# Patient Record
Sex: Male | Born: 1945 | Race: White | Hispanic: No | Marital: Married | Smoking: Former smoker
Health system: Southern US, Community
[De-identification: ages and names within clinical notes are randomized; demographics above are authoritative.]

## PROBLEM LIST (undated history)

## (undated) DIAGNOSIS — R011 Cardiac murmur, unspecified: Secondary | ICD-10-CM

## (undated) DIAGNOSIS — I700 Atherosclerosis of aorta: Secondary | ICD-10-CM

## (undated) DIAGNOSIS — E119 Type 2 diabetes mellitus without complications: Secondary | ICD-10-CM

## (undated) DIAGNOSIS — G4700 Insomnia, unspecified: Secondary | ICD-10-CM

## (undated) DIAGNOSIS — L570 Actinic keratosis: Secondary | ICD-10-CM

## (undated) DIAGNOSIS — E781 Pure hyperglyceridemia: Secondary | ICD-10-CM

## (undated) DIAGNOSIS — N189 Chronic kidney disease, unspecified: Secondary | ICD-10-CM

## (undated) DIAGNOSIS — K5790 Diverticulosis of intestine, part unspecified, without perforation or abscess without bleeding: Secondary | ICD-10-CM

## (undated) DIAGNOSIS — B964 Proteus (mirabilis) (morganii) as the cause of diseases classified elsewhere: Secondary | ICD-10-CM

## (undated) DIAGNOSIS — E786 Lipoprotein deficiency: Secondary | ICD-10-CM

## (undated) DIAGNOSIS — E669 Obesity, unspecified: Secondary | ICD-10-CM

## (undated) DIAGNOSIS — Z87891 Personal history of nicotine dependence: Secondary | ICD-10-CM

## (undated) DIAGNOSIS — N1832 Chronic kidney disease, stage 3b: Secondary | ICD-10-CM

## (undated) DIAGNOSIS — G4730 Sleep apnea, unspecified: Secondary | ICD-10-CM

## (undated) DIAGNOSIS — C4491 Basal cell carcinoma of skin, unspecified: Secondary | ICD-10-CM

## (undated) DIAGNOSIS — I10 Essential (primary) hypertension: Secondary | ICD-10-CM

## (undated) DIAGNOSIS — E785 Hyperlipidemia, unspecified: Secondary | ICD-10-CM

## (undated) DIAGNOSIS — Z87442 Personal history of urinary calculi: Secondary | ICD-10-CM

## (undated) DIAGNOSIS — N529 Male erectile dysfunction, unspecified: Secondary | ICD-10-CM

## (undated) DIAGNOSIS — N400 Enlarged prostate without lower urinary tract symptoms: Secondary | ICD-10-CM

## (undated) HISTORY — PX: MM BCCCP - DIAG MAMMO W/CAD (ARMC HX): HXRAD1021

## (undated) HISTORY — PX: COLONOSCOPY W/ POLYPECTOMY: SHX1380

## (undated) HISTORY — PX: COLON SURGERY: SHX602

## (undated) HISTORY — DX: Actinic keratosis: L57.0

## (undated) HISTORY — PX: TONSILLECTOMY: SUR1361

---
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
## 1999-04-13 ENCOUNTER — Other Ambulatory Visit: Payer: Self-pay | Admitting: Dermatology

## 2006-02-21 ENCOUNTER — Ambulatory Visit: Payer: Self-pay | Admitting: Urology

## 2006-03-30 ENCOUNTER — Observation Stay: Payer: Self-pay | Admitting: Otolaryngology

## 2006-12-03 ENCOUNTER — Ambulatory Visit: Payer: Self-pay | Admitting: Internal Medicine

## 2006-12-05 ENCOUNTER — Ambulatory Visit: Payer: Self-pay | Admitting: Urology

## 2007-05-22 ENCOUNTER — Ambulatory Visit: Payer: Self-pay | Admitting: Gastroenterology

## 2008-08-13 IMAGING — CT CT ABD-PELV W/ CM
1 of 3 series · 12 of 32 positions shown, 18 images · non-contrast
Comparison: none

REASON FOR EXAM: LLQ abd pain
COMMENTS:

[Series 2: abdomen · axial · 0.86mm/px · z∈[-650,-210]mm · 12 of 65 slices shown, 18 images]
[im 5/65  soft-tissue]
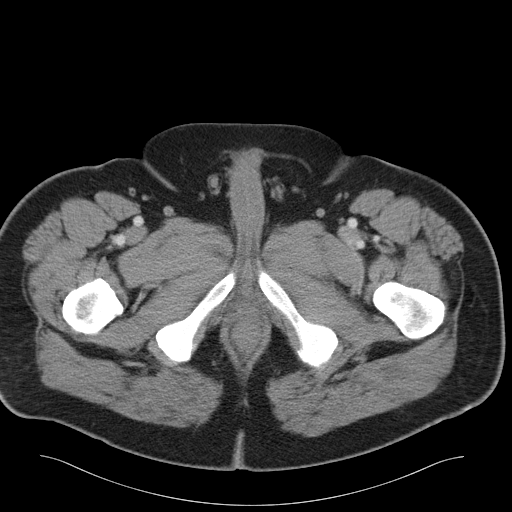
[im 5/65  bone]
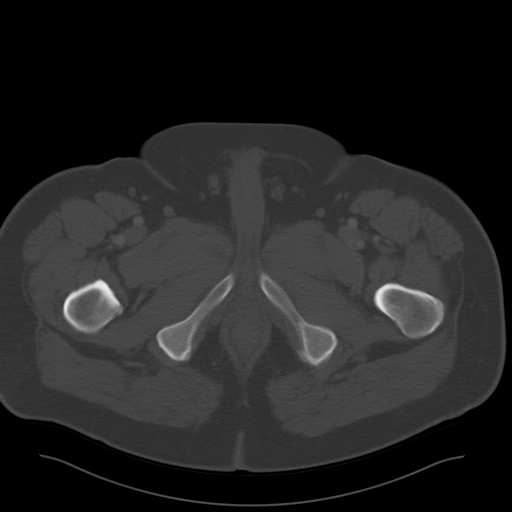
[im 10/65  soft-tissue]
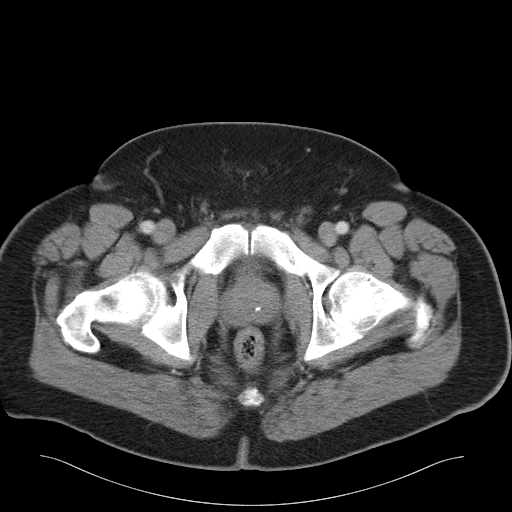
[im 15/65  soft-tissue]
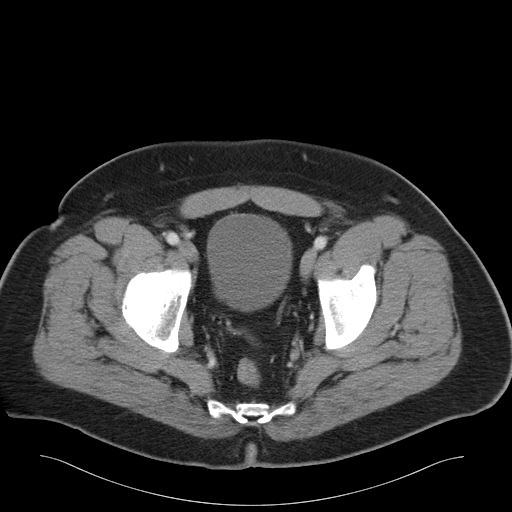
[im 20/65  soft-tissue]
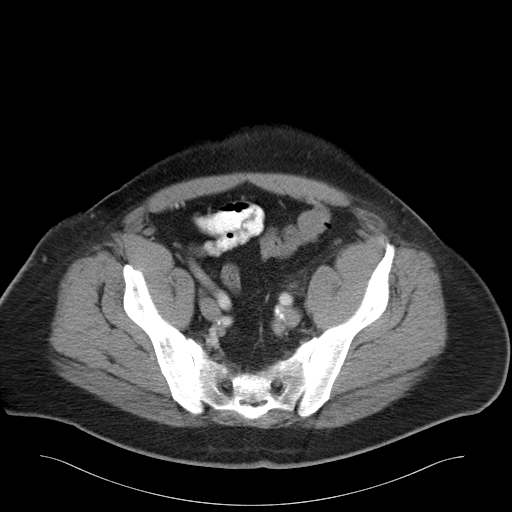
[im 25/65  soft-tissue]
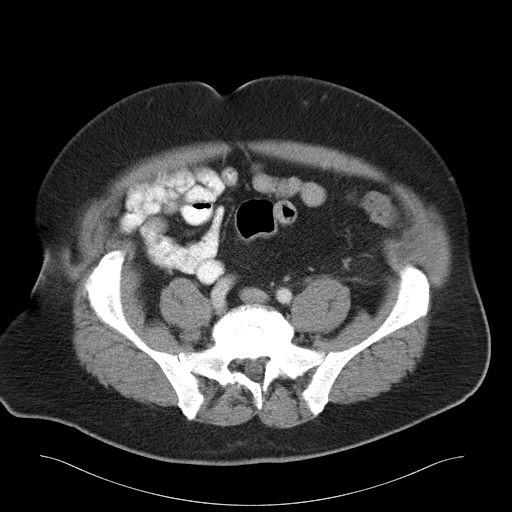
[im 30/65  soft-tissue]
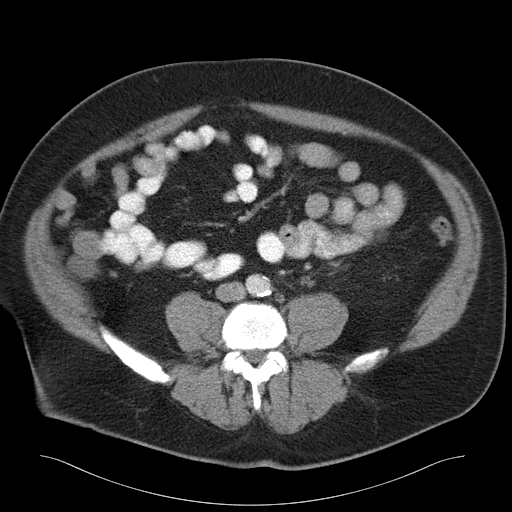
[im 35/65  soft-tissue]
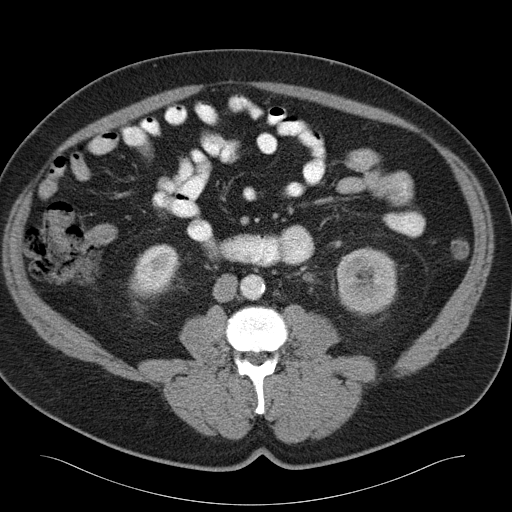
[im 40/65  soft-tissue]
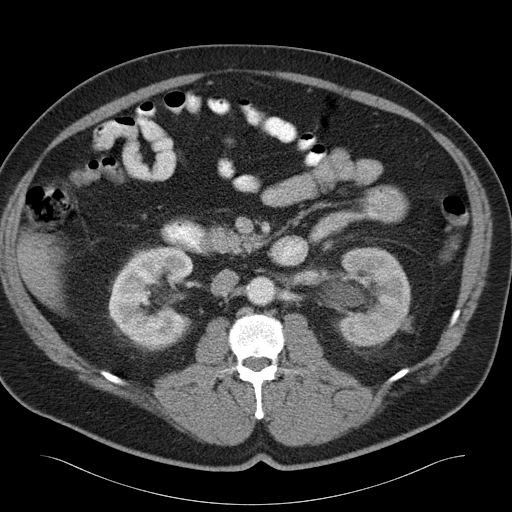
[im 45/65  soft-tissue]
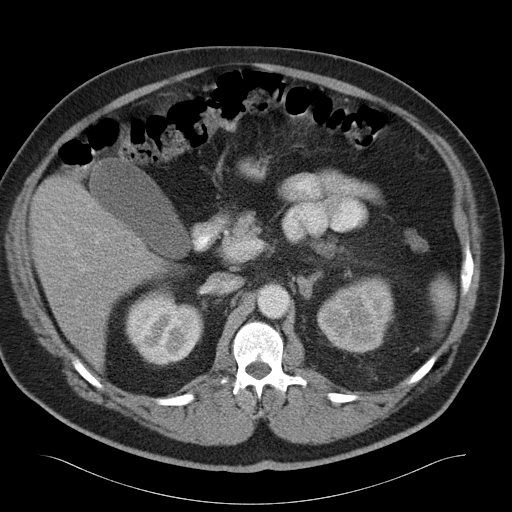
[im 45/65  lung]
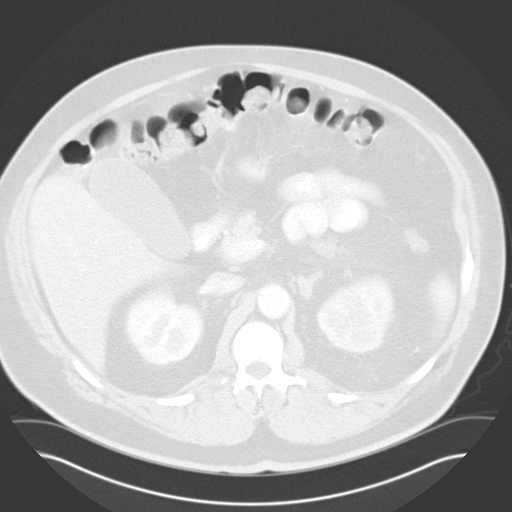
[im 45/65  bone]
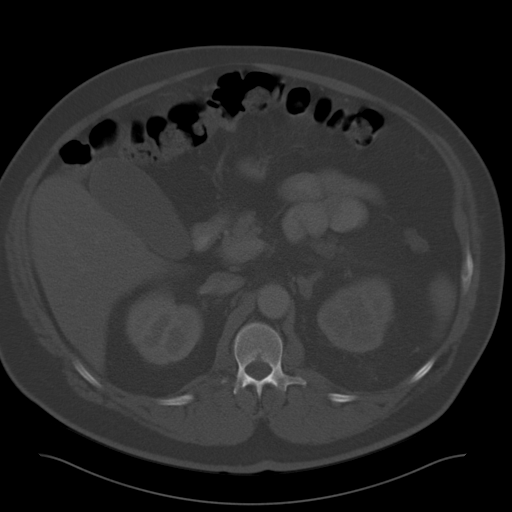
[im 50/65  soft-tissue]
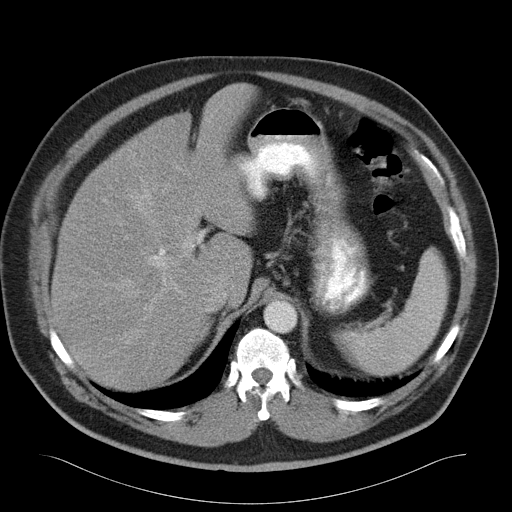
[im 50/65  lung]
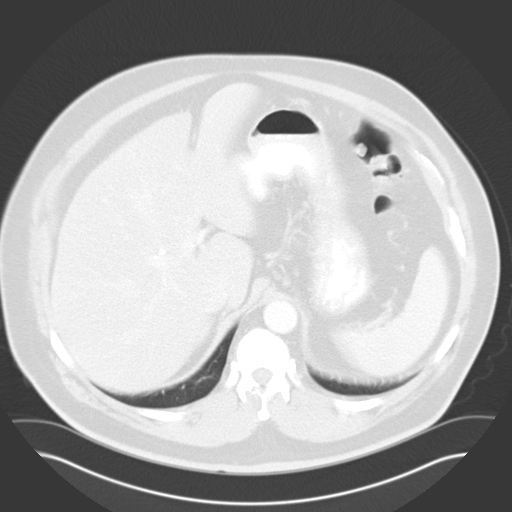
[im 55/65  soft-tissue]
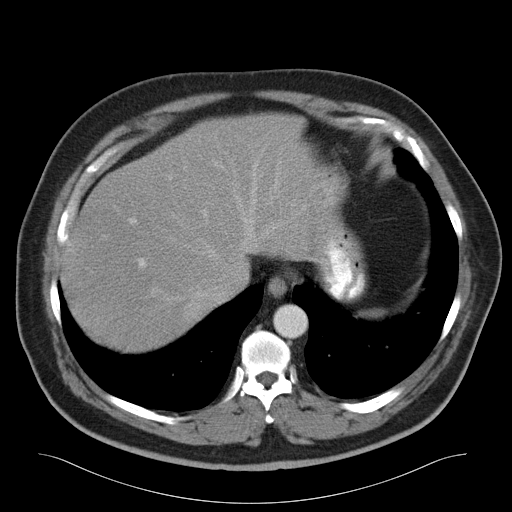
[im 55/65  lung]
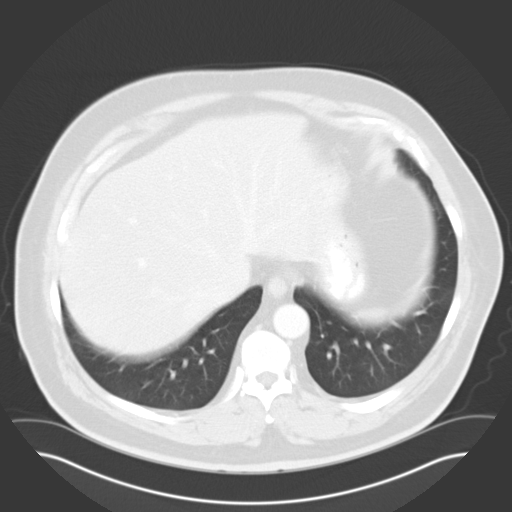
[im 60/65  soft-tissue]
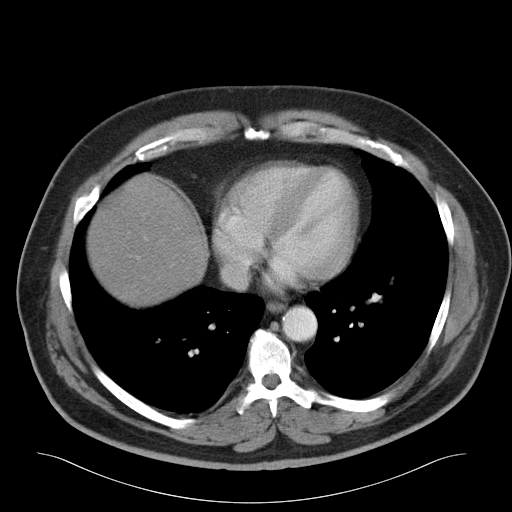
[im 60/65  lung]
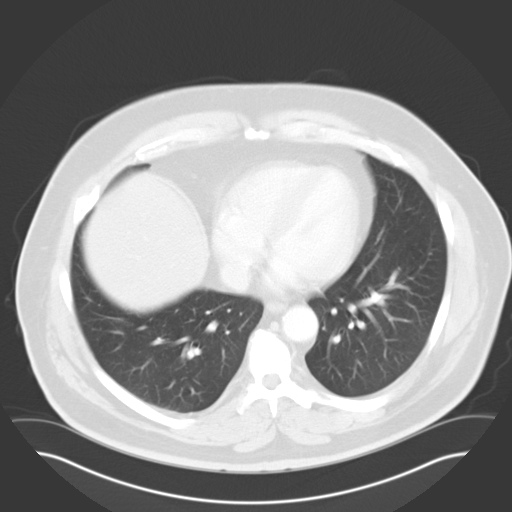

[12 of 32 positions shown; findings below may reference images not displayed]

PROCEDURE:     CT  - CT ABDOMEN / PELVIS  W  - December 03, 2006  [DATE]

RESULT:     IV contrast and oral contrast enhanced CT scan of the abdomen
and pelvis is performed. There is mild to moderate LEFT sided hydronephrosis
and proximal hydroureter. There is a stone demonstrated in the mid LEFT
ureter best seen on image #37 and measuring up to 6 mm in diameter. No
additional LEFT ureteral or LEFT renal calculi are seen. No radiopaque
gallstones are evident. There is prostatic calcification present. The liver,
spleen, pancreas, aorta and kidneys otherwise appear to be normal. The
adrenal glands are unremarkable.  No acute inflammatory changes are evident
elsewhere. The appendix appears to be normal.
IMPRESSION: 1)Obstructing mid LEFT ureteral stone with LEFT sided hydronephrosis.
Urologic follow-up is recommended. Stone diameter is approximately 6 mm.

## 2011-01-03 ENCOUNTER — Ambulatory Visit: Payer: Self-pay | Admitting: Gastroenterology

## 2013-05-07 DIAGNOSIS — C4491 Basal cell carcinoma of skin, unspecified: Secondary | ICD-10-CM

## 2013-05-07 HISTORY — DX: Basal cell carcinoma of skin, unspecified: C44.91

## 2016-04-11 ENCOUNTER — Encounter: Payer: Self-pay | Admitting: *Deleted

## 2016-04-12 ENCOUNTER — Encounter: Payer: Self-pay | Admitting: *Deleted

## 2016-04-12 ENCOUNTER — Encounter
Admission: RE | Disposition: A | Discharge status: Home / Self Care | Payer: Self-pay | Source: Ambulatory Visit | Attending: Unknown Physician Specialty

## 2016-04-12 ENCOUNTER — Ambulatory Visit: Payer: Commercial Managed Care - HMO | Admitting: Anesthesiology

## 2016-04-12 ENCOUNTER — Ambulatory Visit
Admission: RE | Admit: 2016-04-12 | Discharge: 2016-04-12 | Disposition: A | Discharge status: Home / Self Care | Payer: Commercial Managed Care - HMO | Source: Ambulatory Visit | Attending: Unknown Physician Specialty | Admitting: Unknown Physician Specialty

## 2016-04-12 DIAGNOSIS — K640 First degree hemorrhoids: Secondary | ICD-10-CM | POA: Diagnosis not present

## 2016-04-12 DIAGNOSIS — I10 Essential (primary) hypertension: Secondary | ICD-10-CM | POA: Insufficient documentation

## 2016-04-12 DIAGNOSIS — Z6841 Body Mass Index (BMI) 40.0 and over, adult: Secondary | ICD-10-CM | POA: Diagnosis not present

## 2016-04-12 DIAGNOSIS — Z87891 Personal history of nicotine dependence: Secondary | ICD-10-CM | POA: Insufficient documentation

## 2016-04-12 DIAGNOSIS — Z1211 Encounter for screening for malignant neoplasm of colon: Secondary | ICD-10-CM | POA: Diagnosis present

## 2016-04-12 DIAGNOSIS — D125 Benign neoplasm of sigmoid colon: Secondary | ICD-10-CM | POA: Insufficient documentation

## 2016-04-12 DIAGNOSIS — E785 Hyperlipidemia, unspecified: Secondary | ICD-10-CM | POA: Diagnosis not present

## 2016-04-12 DIAGNOSIS — K5730 Diverticulosis of large intestine without perforation or abscess without bleeding: Secondary | ICD-10-CM | POA: Insufficient documentation

## 2016-04-12 DIAGNOSIS — Z79899 Other long term (current) drug therapy: Secondary | ICD-10-CM | POA: Insufficient documentation

## 2016-04-12 DIAGNOSIS — N400 Enlarged prostate without lower urinary tract symptoms: Secondary | ICD-10-CM | POA: Diagnosis not present

## 2016-04-12 DIAGNOSIS — E119 Type 2 diabetes mellitus without complications: Secondary | ICD-10-CM | POA: Diagnosis not present

## 2016-04-12 DIAGNOSIS — Z7982 Long term (current) use of aspirin: Secondary | ICD-10-CM | POA: Insufficient documentation

## 2016-04-12 DIAGNOSIS — E669 Obesity, unspecified: Secondary | ICD-10-CM | POA: Diagnosis not present

## 2016-04-12 DIAGNOSIS — K621 Rectal polyp: Secondary | ICD-10-CM | POA: Diagnosis not present

## 2016-04-12 HISTORY — DX: Hyperlipidemia, unspecified: E78.5

## 2016-04-12 HISTORY — DX: Pure hyperglyceridemia: E78.1

## 2016-04-12 HISTORY — PX: COLONOSCOPY WITH PROPOFOL: SHX5780

## 2016-04-12 HISTORY — DX: Benign prostatic hyperplasia without lower urinary tract symptoms: N40.0

## 2016-04-12 HISTORY — DX: Type 2 diabetes mellitus without complications: E11.9

## 2016-04-12 HISTORY — DX: Essential (primary) hypertension: I10

## 2016-04-12 HISTORY — DX: Obesity, unspecified: E66.9

## 2016-04-12 HISTORY — DX: Lipoprotein deficiency: E78.6

## 2016-04-12 LAB — GLUCOSE, CAPILLARY: GLUCOSE-CAPILLARY: 191 mg/dL — AB (ref 65–99)

## 2016-04-12 SURGERY — COLONOSCOPY WITH PROPOFOL
Anesthesia: General

## 2016-04-12 MED ORDER — SODIUM CHLORIDE 0.9 % IV SOLN
INTRAVENOUS | Status: DC
  Administered 2016-04-12: 07:00:00 via INTRAVENOUS

## 2016-04-12 MED ORDER — LIDOCAINE HCL (PF) 2 % IJ SOLN
INTRAMUSCULAR | Status: DC | PRN
  Administered 2016-04-12: 60 mg

## 2016-04-12 MED ORDER — PROPOFOL 500 MG/50ML IV EMUL
INTRAVENOUS | Status: DC | PRN
  Administered 2016-04-12: 75 ug/kg/min via INTRAVENOUS

## 2016-04-12 MED ORDER — FENTANYL CITRATE (PF) 100 MCG/2ML IJ SOLN
INTRAMUSCULAR | Status: DC | PRN
  Administered 2016-04-12: 50 ug via INTRAVENOUS

## 2016-04-12 MED ORDER — MIDAZOLAM HCL 5 MG/5ML IJ SOLN
INTRAMUSCULAR | Status: DC | PRN
  Administered 2016-04-12: 1 mg via INTRAVENOUS

## 2016-04-12 MED ORDER — SODIUM CHLORIDE 0.9 % IV SOLN: INTRAVENOUS | Status: DC

## 2016-04-12 MED ORDER — PROPOFOL 10 MG/ML IV BOLUS
INTRAVENOUS | Status: DC | PRN
  Administered 2016-04-12: 20 mg via INTRAVENOUS
  Administered 2016-04-12: 30 mg via INTRAVENOUS

## 2016-04-12 NOTE — Op Note (Signed)
[facility] Gastroenterology Patient Name: Stephen Mahoney Procedure Date: 04/12/2016 7:29 AM MRN: [id number] Account #: [id number] Date of Birth: 1946/01/08 Admit Type: Outpatient Age: 70 Room: [facility] ENDO ROOM 1 Gender: Male Note Status: Finalized Procedure:            Colonoscopy Indications:          High risk colon cancer surveillance: Personal history                        of colonic polyps Providers:            Manya Silvas, MD Referring MD:         Dion Body (Referring MD) Medicines:            Propofol per Anesthesia Complications:        No immediate complications. Procedure:            Pre-Anesthesia Assessment:                       - After reviewing the risks and benefits, the patient                        was deemed in satisfactory condition to undergo the                        procedure.                       After obtaining informed consent, the colonoscope was                        passed under direct vision. Throughout the procedure,                        the patient's blood pressure, pulse, and oxygen                        saturations were monitored continuously. The                        Colonoscope was introduced through the anus and                        advanced to the the cecum, identified by appendiceal                        orifice and ileocecal valve. The colonoscopy was                        performed without difficulty. The patient tolerated the                        procedure well. The quality of the bowel preparation                        was excellent. Findings:      Rectal exam of prostate negative.      Two sessile polyps were found in the rectum. The polyps were diminutive       in size. These polyps were removed with a jumbo cold forceps. Resection       and retrieval were complete.  A diminutive polyp was found in the sigmoid colon. The polyp was       sessile. The polyp was removed with a jumbo cold  forceps. Resection and       retrieval were complete.      A small polyp was found in the proximal sigmoid colon. The polyp was       sessile. The polyp was removed with a hot snare. Resection and retrieval       were complete.      Multiple small-mouthed diverticula were found in the sigmoid colon,       descending colon and transverse colon.      Internal hemorrhoids were found during endoscopy. The hemorrhoids were       small, medium-sized and Grade I (internal hemorrhoids that do not       prolapse). Impression:           - Two diminutive polyps in the rectum, removed with a                        jumbo cold forceps. Resected and retrieved.                       - One diminutive polyp in the sigmoid colon, removed                        with a jumbo cold forceps. Resected and retrieved.                       - One small polyp in the proximal sigmoid colon,                        removed with a hot snare. Resected and retrieved.                       - Diverticulosis in the sigmoid colon, in the                        descending colon and in the transverse colon.                       - Internal hemorrhoids. Recommendation:       - Await pathology results. Manya Silvas, MD 04/12/2016 7:57:02 AM This report has been signed electronically. Number of Addenda: 0 Note Initiated On: 04/12/2016 7:29 AM Scope Withdrawal Time: 0 hours 13 minutes 40 seconds  Total Procedure Duration: 0 hours 19 minutes 8 seconds       [facility]

## 2016-04-12 NOTE — Anesthesia Preprocedure Evaluation (Signed)
Anesthesia Evaluation  Patient identified by MRN, date of birth, ID band Patient awake    Reviewed: Allergy & Precautions, H&P , NPO status , Patient's Chart, lab work & pertinent test results  History of Anesthesia Complications Negative for: history of anesthetic complications  Airway Mallampati: III  TM Distance: >3 FB Neck ROM: limited    Dental  (+) Poor Dentition   Pulmonary neg shortness of breath, former smoker,    Pulmonary exam normal breath sounds clear to auscultation       Cardiovascular Exercise Tolerance: Good hypertension, (-) angina(-) Past MI and (-) DOE Normal cardiovascular exam Rhythm:regular Rate:Normal     Neuro/Psych negative neurological ROS  negative psych ROS   GI/Hepatic negative GI ROS, Neg liver ROS, neg GERD  ,  Endo/Other  diabetes, Type 2  Renal/GU negative Renal ROS  negative genitourinary   Musculoskeletal   Abdominal   Peds  Hematology negative hematology ROS (+)   Anesthesia Other Findings Past Medical History:   Hypertension                                                 Obesity                                                      BPH (benign prostatic hyperplasia)                           Diabetes mellitus without complication (HCC)                 High blood triglycerides                                     Hyperlipidemia with low HDL                                 Past Surgical History:   COLON SURGERY                                                 COLONOSCOPY W/ POLYPECTOMY                                    MM BCCCP - DIAG MAMMO W/CAD ([facility] HX)           N/A                Comment:left lip nose   TONSILLECTOMY                                                BMI    Body Mass Index   40.42 kg/m 2    Past Medical History:  Hypertension                                                 Obesity                                                      BPH (benign  prostatic hyperplasia)                           Diabetes mellitus without complication (HCC)                 High blood triglycerides                                     Hyperlipidemia with low HDL                                   Reproductive/Obstetrics negative OB ROS                             Anesthesia Physical Anesthesia Plan  ASA: III  Anesthesia Plan: General   Post-op Pain Management:    Induction:   Airway Management Planned:   Additional Equipment:   Intra-op Plan:   Post-operative Plan:   Informed Consent: I have reviewed the patients History and Physical, chart, labs and discussed the procedure including the risks, benefits and alternatives for the proposed anesthesia with the patient or authorized representative who has indicated his/her understanding and acceptance.   Dental Advisory Given  Plan Discussed with: Anesthesiologist, CRNA and Surgeon  Anesthesia Plan Comments:         Anesthesia Quick Evaluation

## 2016-04-12 NOTE — H&P (Signed)
   Primary Care Physician:  No primary care provider on file. Primary Gastroenterologist:  Dr. Vira Agar  Pre-Procedure History & Physical: HPI:  Stephen Mahoney is a 70 y.o. male is here for an colonoscopy.   Past Medical History  Diagnosis Date  . Hypertension   . Obesity   . BPH (benign prostatic hyperplasia)   . Diabetes mellitus without complication ([place])   . High blood triglycerides   . Hyperlipidemia with low HDL     Past Surgical History  Procedure Laterality Date  . Colon surgery    . Colonoscopy w/ polypectomy    . Mm bcccp - diag mammo w/cad ([facility] hx) N/A     left lip nose  . Tonsillectomy      Prior to Admission medications   Medication Sig Start Date End Date Taking? Authorizing Provider  amLODipine (NORVASC) 5 MG tablet Take 5 mg by mouth daily.   Yes Historical Provider, MD  aspirin 81 MG tablet Take 81 mg by mouth daily.   Yes Historical Provider, MD  Dutasteride-Tamsulosin HCl (JALYN) 0.5-0.4 MG CAPS Take by mouth.   Yes Historical Provider, MD  glucose blood test strip 1 each by Other route as needed for other. Use as instructed   Yes Historical Provider, MD  Liraglutide (VICTOZA [place]) Inject into the skin.   Yes Historical Provider, MD  metFORMIN (GLUCOPHAGE) 500 MG tablet Take by mouth 2 (two) times daily with a meal.   Yes Historical Provider, MD  valsartan-hydrochlorothiazide (DIOVAN-HCT) 320-25 MG tablet Take 1 tablet by mouth daily.   Yes Historical Provider, MD    Allergies as of 04/05/2016  . (Not on File)    History reviewed. No pertinent family history.  Social History   Social History  . Marital Status: Married    Spouse Name: N/A  . Number of Children: N/A  . Years of Education: N/A   Occupational History  . Not on file.   Social History Main Topics  . Smoking status: Former Smoker    Quit date: 04/12/2014  . Smokeless tobacco: Not on file  . Alcohol Use: No  . Drug Use: No  . Sexual Activity: Not on file   Other Topics Concern  .  Not on file   Social History Narrative    Review of Systems: See HPI, otherwise negative ROS  Physical Exam: BP 154/87 mmHg  Pulse 77  Temp(Src) 98.5 F (36.9 C) (Tympanic)  Resp 18  Ht 6\' 2"  (1.88 m)  Wt 142.883 kg (315 lb)  BMI 40.43 kg/m2  SpO2 95% General:   Alert,  pleasant and cooperative in NAD Head:  Normocephalic and atraumatic. Neck:  Supple; no masses or thyromegaly. Lungs:  Clear throughout to auscultation.    Heart:  Regular rate and rhythm. Abdomen:  Soft, nontender and nondistended. Normal bowel sounds, without guarding, and without rebound.   Neurologic:  Alert and  oriented x4;  grossly normal neurologically.  Impression/Plan: Stephen Mahoney is here for an colonoscopy to be performed for [facility] colon polyps  Risks, benefits, limitations, and alternatives regarding  colonoscopy have been reviewed with the patient.  Questions have been answered.  All parties agreeable.   Gaylyn Cheers, MD  04/12/2016, 7:22 AM

## 2016-04-12 NOTE — Anesthesia Postprocedure Evaluation (Signed)
Anesthesia Post Note  Patient: Stephen Mahoney  Procedure(s) Performed: Procedure(s) (LRB): COLONOSCOPY WITH PROPOFOL (N/A)  Patient location during evaluation: Endoscopy Anesthesia Type: General Level of consciousness: awake and alert Pain management: pain level controlled Vital Signs Assessment: post-procedure vital signs reviewed and stable Respiratory status: spontaneous breathing, nonlabored ventilation, respiratory function stable and patient connected to nasal cannula oxygen Cardiovascular status: blood pressure returned to baseline and stable Postop Assessment: no signs of nausea or vomiting Anesthetic complications: no    Last Vitals:  Filed Vitals:   04/12/16 0810 04/12/16 0820  BP: 120/67 108/79  Pulse: 80 79  Temp:    Resp: 15 15    Last Pain: There were no vitals filed for this visit.               Precious Haws Piscitello

## 2016-04-12 NOTE — Transfer of Care (Signed)
Immediate Anesthesia Transfer of Care Note  Patient: Stephen Mahoney  Procedure(s) Performed: Procedure(s): COLONOSCOPY WITH PROPOFOL (N/A)  Patient Location: PACU  Anesthesia Type:General  Level of Consciousness: sedated  Airway & Oxygen Therapy: Patient Spontanous Breathing and Patient connected to nasal cannula oxygen  Post-op Assessment: Report given to RN and Post -op Vital signs reviewed and stable  Post vital signs: Reviewed and stable  Last Vitals:  Filed Vitals:   04/12/16 0658  BP: 154/87  Pulse: 77  Temp: 36.9 C  Resp: 18    Last Pain: There were no vitals filed for this visit.       Complications: No apparent anesthesia complications

## 2016-04-13 LAB — SURGICAL PATHOLOGY

## 2016-04-17 ENCOUNTER — Encounter: Payer: Self-pay | Admitting: Unknown Physician Specialty

## 2018-03-12 ENCOUNTER — Other Ambulatory Visit: Payer: Self-pay | Admitting: Family Medicine

## 2018-03-12 ENCOUNTER — Ambulatory Visit
Admission: RE | Admit: 2018-03-12 | Discharge: 2018-03-12 | Disposition: A | Discharge status: Home / Self Care | Payer: Commercial Managed Care - HMO | Source: Ambulatory Visit | Attending: Family Medicine | Admitting: Family Medicine

## 2018-03-12 DIAGNOSIS — R220 Localized swelling, mass and lump, head: Secondary | ICD-10-CM

## 2019-11-21 IMAGING — CT CT NECK W/O CM
4 of 5 series · 15 of 33 positions shown, 17 images · non-contrast
Comparison: None.

CLINICAL DATA: Swelling on left side of neck beginning last
evening.

EXAM:
CT NECK WITHOUT CONTRAST
TECHNIQUE: Multidetector CT imaging of the neck was performed following the
standard protocol without intravenous contrast.

[Series 2: axial neck · axial · 0.65mm/px · z∈[-238,-110]mm · 3 of 129 slices shown]
[im 33/129  bone]
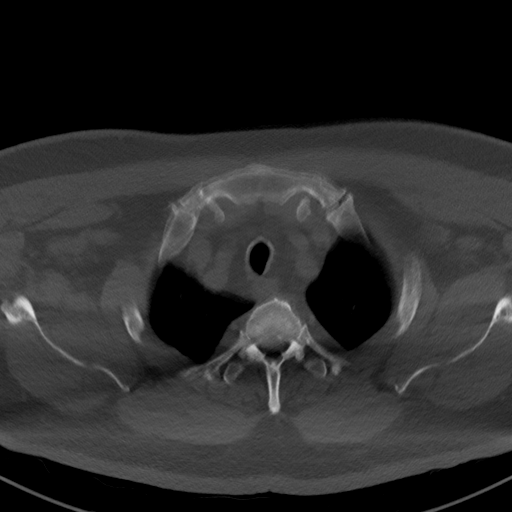
[im 65/129  bone]
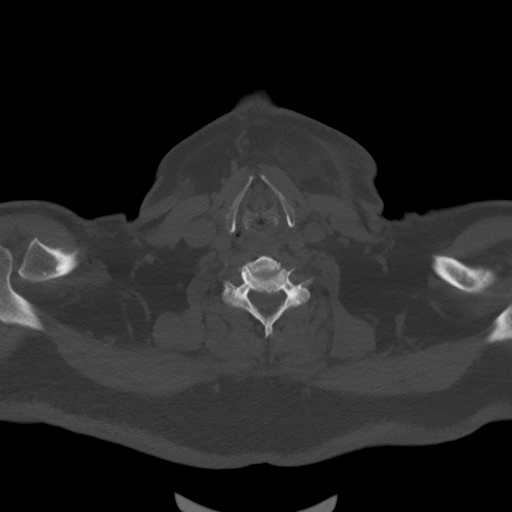
[im 97/129  bone]
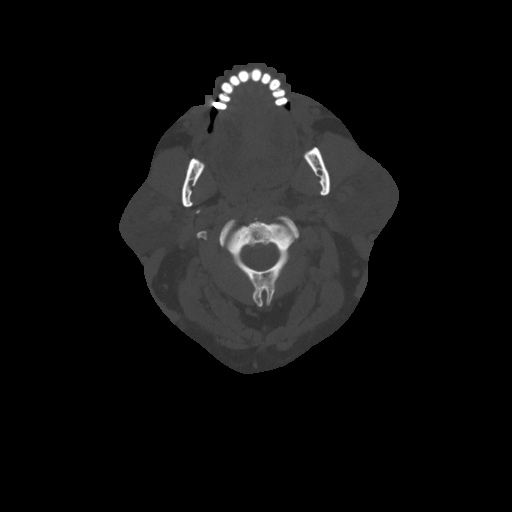

[Series 6: sag neck · sagittal · 0.62mm/px · 5 of 109 slices shown, 6 images]
[im 37/109  bone]
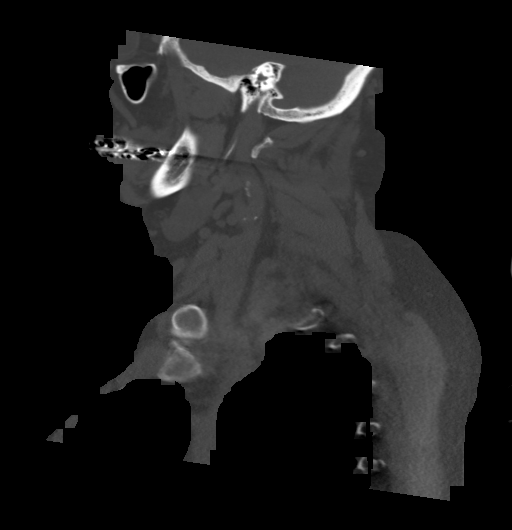
[im 46/109  bone]
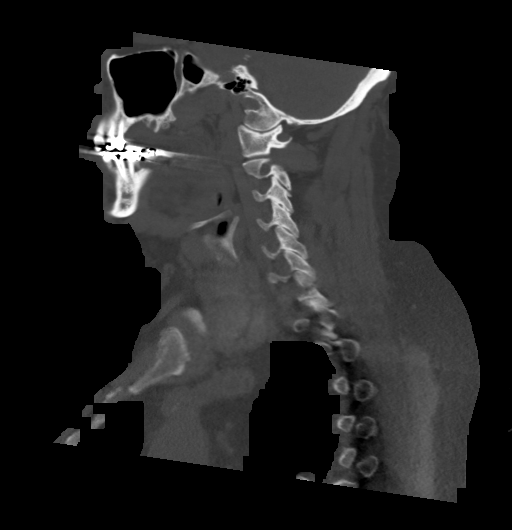
[im 55/109  soft-tissue]
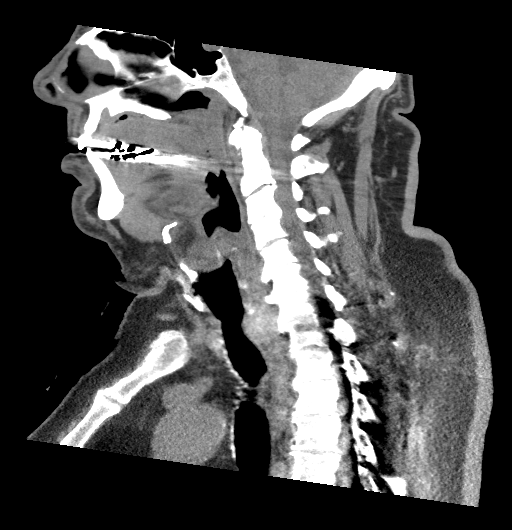
[im 55/109  bone]
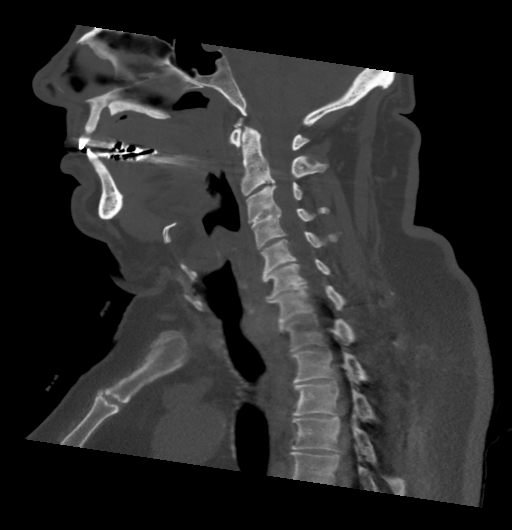
[im 64/109  bone]
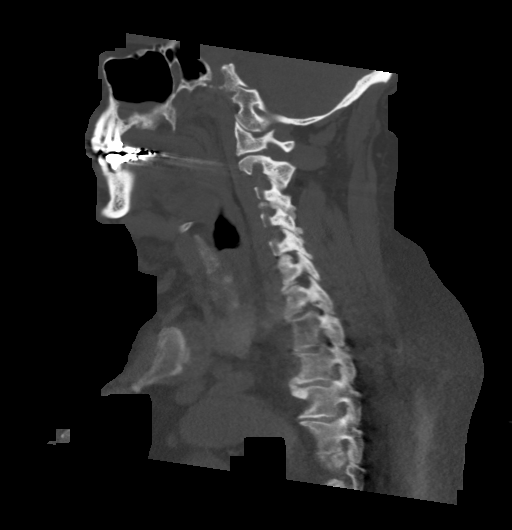
[im 73/109  bone]
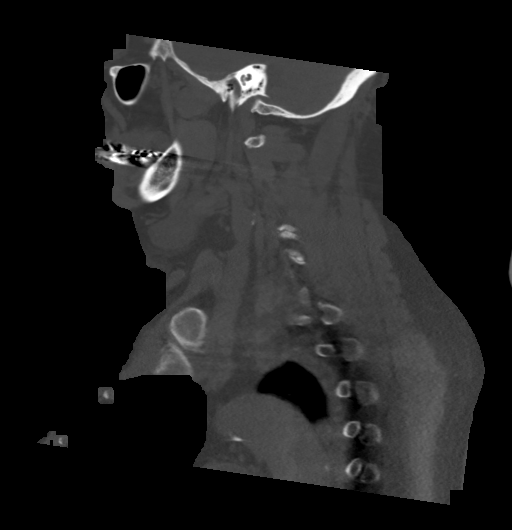

[Series 7: cor neck · coronal · 0.51mm/px · 3 of 145 slices shown]
[im 29/145  bone]
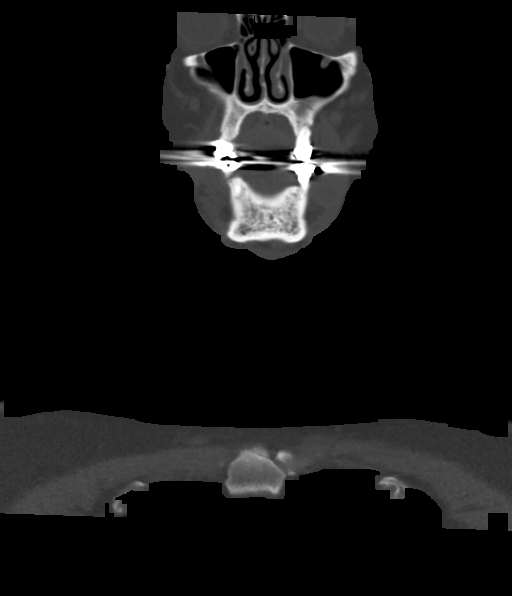
[im 58/145  bone]
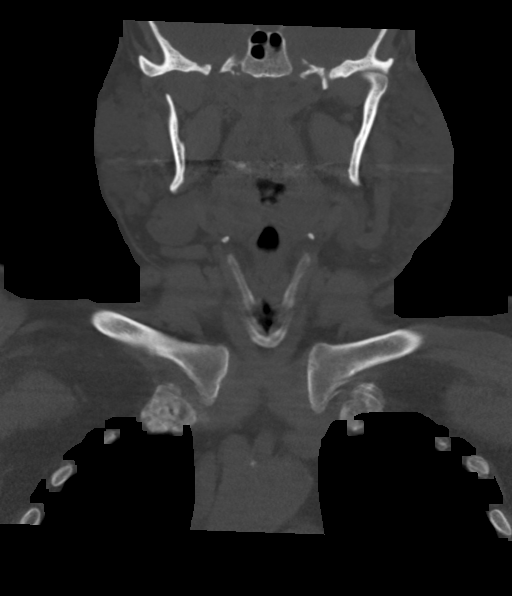
[im 87/145  bone]
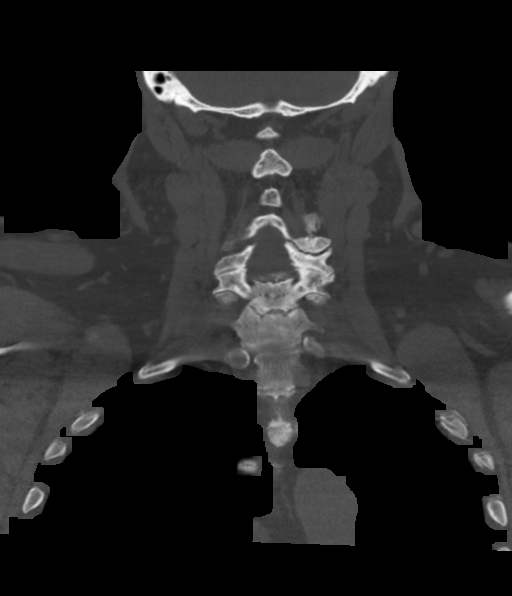

[Series 8: orthogonal ax · axial · 0.49mm/px · z∈[-323,-137]mm · 4 of 167 slices shown, 5 images]
[im 34/167  soft-tissue]
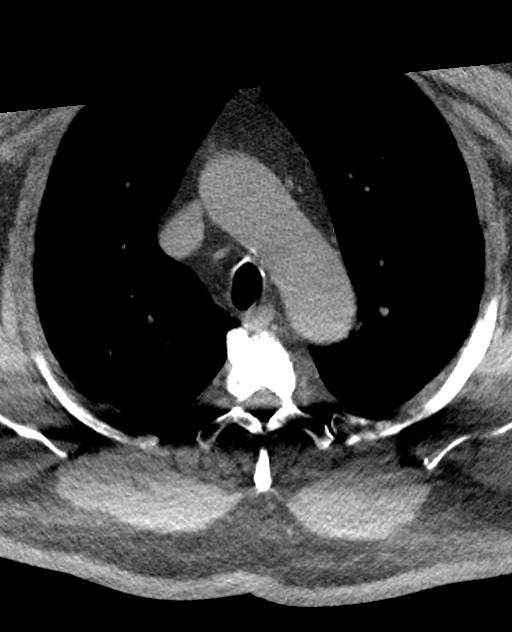
[im 34/167  bone]
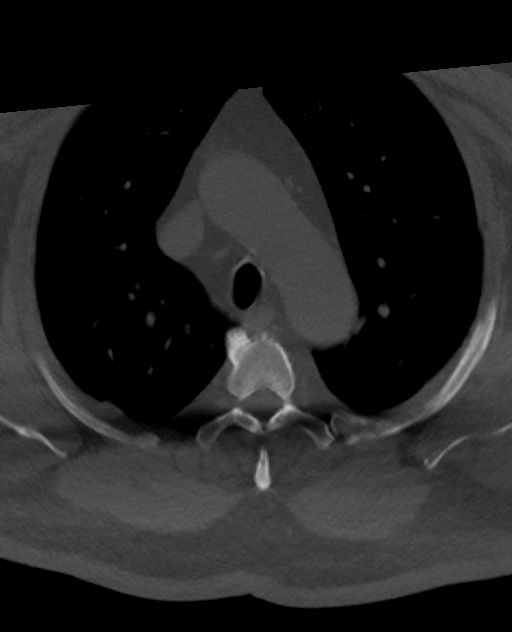
[im 67/167  bone]
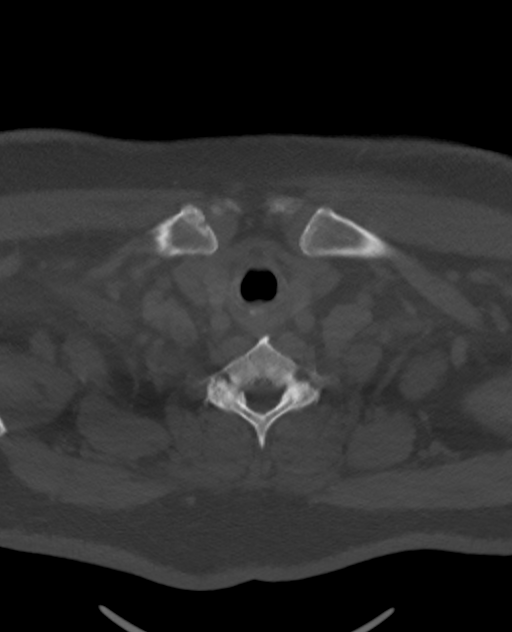
[im 100/167  bone]
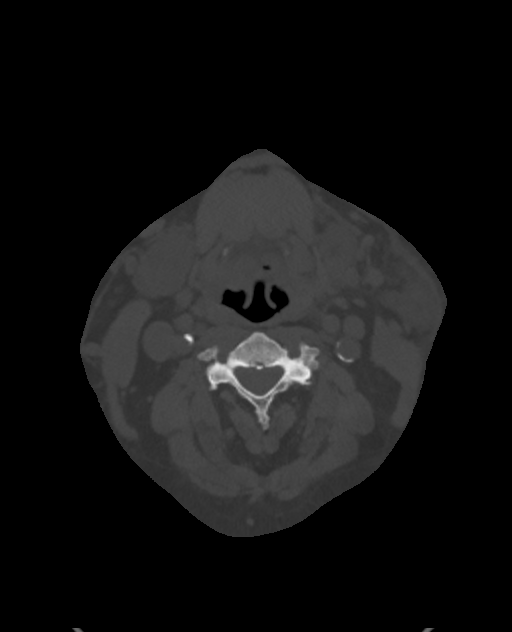
[im 133/167  bone]
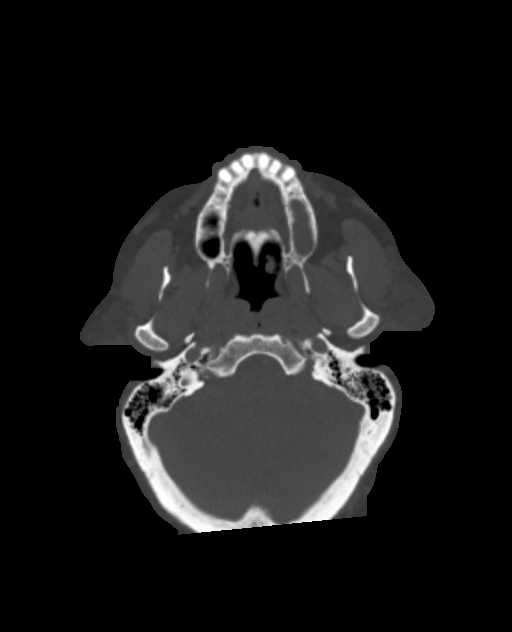

[15 of 33 positions shown; findings below may reference images not displayed]

FINDINGS: Pharynx and larynx: No focal mucosal or submucosal lesions are
present. The soft palate apposes the posterior oropharynx and
inferior nasopharynx. The hypopharynx is clear. Tongue base is
unremarkable. Vocal cords are midline and symmetric.

Salivary glands: Diffuse inflammatory changes surround the left
parotid gland. No discrete mass lesion is present. There is no
ductal dilation. Punctate calcifications are present within the
parotid glands bilaterally. Submandibular glands are within normal
limits bilaterally.

Thyroid: Normal.

Lymph nodes: No significant cervical adenopathy is present.

Vascular: Atherosclerotic calcifications are present at the carotid
bifurcations bilaterally.

Limited intracranial: Unremarkable.

Visualized orbits: Globes and orbits are within normal limits.

Mastoids and visualized paranasal sinuses: Mucosal thickening is
present along the floor of the left maxillary sinus. The paranasal
sinuses and mastoid air cells are otherwise clear.

Skeleton: Extensive degenerative changes are present throughout the
cervical spine. There is a dialysis fusion across the disc space at
C3-4. Osseous foraminal narrowing is greatest at C3-4 and C4-5. The
mandible is intact and located.

Upper chest: Lung apices are clear.
IMPRESSION: 1. Diffuse inflammatory changes mild enlargement of the left parotid
gland without ductal dilation. Findings are most compatible with a
nonspecific parotitis, likely viral.
2. Punctate calcifications within the parotid glands bilaterally.
Question Sjogren's syndrome. This does not have the typical
appearance of benign lymphoepithelial lesions of HIV.

## 2020-03-14 ENCOUNTER — Other Ambulatory Visit: Payer: Self-pay | Admitting: Dermatology

## 2020-04-21 ENCOUNTER — Ambulatory Visit (INDEPENDENT_AMBULATORY_CARE_PROVIDER_SITE_OTHER): Payer: Medicare Other | Admitting: Dermatology

## 2020-04-21 ENCOUNTER — Other Ambulatory Visit: Payer: Self-pay

## 2020-04-21 ENCOUNTER — Encounter: Payer: Self-pay | Admitting: Dermatology

## 2020-04-21 DIAGNOSIS — C44719 Basal cell carcinoma of skin of left lower limb, including hip: Secondary | ICD-10-CM | POA: Diagnosis not present

## 2020-04-21 DIAGNOSIS — Z1283 Encounter for screening for malignant neoplasm of skin: Secondary | ICD-10-CM | POA: Diagnosis not present

## 2020-04-21 DIAGNOSIS — Z85828 Personal history of other malignant neoplasm of skin: Secondary | ICD-10-CM

## 2020-04-21 DIAGNOSIS — L814 Other melanin hyperpigmentation: Secondary | ICD-10-CM

## 2020-04-21 DIAGNOSIS — D1801 Hemangioma of skin and subcutaneous tissue: Secondary | ICD-10-CM

## 2020-04-21 DIAGNOSIS — D229 Melanocytic nevi, unspecified: Secondary | ICD-10-CM

## 2020-04-21 DIAGNOSIS — L719 Rosacea, unspecified: Secondary | ICD-10-CM

## 2020-04-21 DIAGNOSIS — L738 Other specified follicular disorders: Secondary | ICD-10-CM

## 2020-04-21 DIAGNOSIS — D485 Neoplasm of uncertain behavior of skin: Secondary | ICD-10-CM

## 2020-04-21 DIAGNOSIS — L578 Other skin changes due to chronic exposure to nonionizing radiation: Secondary | ICD-10-CM

## 2020-04-21 DIAGNOSIS — L711 Rhinophyma: Secondary | ICD-10-CM | POA: Diagnosis not present

## 2020-04-21 DIAGNOSIS — L821 Other seborrheic keratosis: Secondary | ICD-10-CM

## 2020-04-21 MED ORDER — DOXYCYCLINE HYCLATE 20 MG PO TABS: ORAL_TABLET | ORAL | 11 refills | Status: DC

## 2020-04-21 NOTE — Progress Notes (Signed)
Follow-Up Visit   Subjective  Stephen Mahoney is a 74 y.o. male who presents for the following: Annual Exam (hx of BCC ) and lesion (L lower leg x 6-12 months - very itchy, crusted). The patient presents for Total-Body Skin Exam (TBSE) for skin cancer screening and mole check.  The following portions of the chart were reviewed this encounter and updated as appropriate:  Allergies  Meds  Problems  Med Hx  Surg Hx  Fam Hx     Review of Systems:  No other skin or systemic complaints except as noted in HPI or Assessment and Plan.  Objective  Well appearing patient in no apparent distress; mood and affect are within normal limits.  A full examination was performed including scalp, head, eyes, ears, nose, lips, neck, chest, axillae, abdomen, back, buttocks, bilateral upper extremities, bilateral lower extremities, hands, feet, fingers, toes, fingernails, and toenails. All findings within normal limits unless otherwise noted below.  Objective  L mid lat pretibial: Pearly crust plaque 1.5 cm   Objective  Face: Erythema with telangiectasias and thickening of skin on the nose  Objective  Forehead: Small yellow papules with a central dell.    Assessment & Plan  Neoplasm of uncertain behavior of skin L mid lat pretibial  Epidermal / dermal shaving  Lesion length (cm):  1.5 Lesion width (cm):  1.5 Margin per side (cm):  0.2 Total excision diameter (cm):  1.9 Informed consent: discussed and consent obtained   Timeout: patient name, date of birth, surgical site, and procedure verified   Procedure prep:  Patient was prepped and draped in usual sterile fashion Prep type:  Isopropyl alcohol Anesthesia: the lesion was anesthetized in a standard fashion   Anesthetic:  1% lidocaine w/ epinephrine 1-100,000 buffered w/ 8.4% NaHCO3 Instrument used: flexible razor blade   Hemostasis achieved with: pressure, aluminum chloride and electrodesiccation   Outcome: patient tolerated procedure  well   Post-procedure details: sterile dressing applied and wound care instructions given   Dressing type: bandage and petrolatum    Destruction of lesion Complexity: extensive   Destruction method: electrodesiccation and curettage   Informed consent: discussed and consent obtained   Timeout:  patient name, date of birth, surgical site, and procedure verified Procedure prep:  Patient was prepped and draped in usual sterile fashion Prep type:  Isopropyl alcohol Anesthesia: the lesion was anesthetized in a standard fashion   Anesthetic:  1% lidocaine w/ epinephrine 1-100,000 buffered w/ 8.4% NaHCO3 Curettage performed in three different directions: Yes   Electrodesiccation performed over the curetted area: Yes   Lesion length (cm):  1.5 Lesion width (cm):  1.5 Margin per side (cm):  0.2 Final wound size (cm):  1.9 Hemostasis achieved with:  pressure, aluminum chloride and electrodesiccation Outcome: patient tolerated procedure well with no complications   Post-procedure details: sterile dressing applied and wound care instructions given   Dressing type: bandage and petrolatum    Specimen 1 - Surgical pathology Differential Diagnosis: D48.5 r/o BCC ED&C today  Check Margins: No Pearly crust plaque 1.5 cm  Rosacea Face  With rhinophyma -  Continue Doxycycline 20mg  po BID #[id number]. Doxycycline should be taken with food to prevent nausea. Do not lay down for 30 minutes after taking. Be cautious with sun exposure and use good sun protection while on this medication. Pregnant women should not take this medication.     doxycycline (PERIOSTAT) 20 MG tablet - Face  Sebaceous hyperplasia Forehead  Benign, observe.    Skin  cancer screening   Lentigines - Scattered tan macules - Discussed due to sun exposure - Benign, observe - Call for any changes  Seborrheic Keratoses - Stuck-on, waxy, tan-brown papules and plaques  - Discussed benign etiology and prognosis. - Observe -  Call for any changes  Melanocytic Nevi - Tan-brown and/or pink-flesh-colored symmetric macules and papules - Benign appearing on exam today - Observation - Call clinic for new or changing moles - Recommend daily use of broad spectrum spf 30+ sunscreen to sun-exposed areas.   Hemangiomas - Red papules - Discussed benign nature - Observe - Call for any changes  Actinic Damage - diffuse scaly erythematous macules with underlying dyspigmentation - Recommend daily broad spectrum sunscreen SPF 30+ to sun-exposed areas, reapply every 2 hours as needed.  - Call for new or changing lesions.  History of Basal Cell Carcinoma of the Skin - No evidence of recurrence today - Recommend regular full body skin exams - Recommend daily broad spectrum sunscreen SPF 30+ to sun-exposed areas, reapply every 2 hours as needed.  - Call if any new or changing lesions are noted between office visits  Skin cancer screening performed today.  Return in about 6 months (around 10/22/2020) for sun exposed areas.  Luther Redo, CMA, am acting as scribe for Sarina Ser, MD .  Documentation: I have reviewed the above documentation for accuracy and completeness, and I agree with the above.  Sarina Ser, MD

## 2020-04-21 NOTE — Patient Instructions (Signed)
Shave Excision Benign Lesion Wound Care Instructions  . Leave the original bandage on for 24 hours if possible.  If the bandage becomes soaked or soiled before that time, it is OK to remove it and examine the wound.  A small amount of post-operative bleeding is normal.  If excessive bleeding occurs, remove the bandage, place gauze over the site and apply continuous pressure (no peeking) over the area for 20-30 minutes.  If this does not stop the bleeding, try again for 40 minutes.  If this does not work, please call our clinic as soon as possible (even if after-hours).    . Twice a day, cleanse the wound with soap and water.  If a thick crust develops you may use a Q-tip dipped into dilute hydrogen peroxide (mix 1:1 with water) to dissolve it.  Hydrogen peroxide can slow the healing process, so use it only as needed.  After washing, apply Vaseline jelly or Polysporin ointment.  For best healing, the wound should be covered with a layer of ointment at all times.  This may mean re-applying the ointment several times a day.  For open wounds, continue until it has healed.    . If you have any swelling, keep the area elevated.  . Some redness, tenderness and white or yellow material in the wound is normal healing.  If the area becomes very sore and red, or develops a thick yellow-green material (pus), it may be infected; please notify us.    . Wound healing continues for up to one year following surgery.  It is not unusual to experience pain in the scar from time to time during the interval.  If the pain becomes severe or the scar thickens, you should notify the office.  A slight amount of redness in a scar is expected for the first six months.  After six months, the redness subsides and the scar will soften and fade.  The color difference becomes less noticeable with time.  If there are any problems, return for a post-op surgery check at your earliest convenience.  . Please call our office for any questions  or concerns.   Electrodesiccation and Curettage ("Scrape and Burn") Wound Care Instructions  1. Leave the original bandage on for 24 hours if possible.  If the bandage becomes soaked or soiled before that time, it is OK to remove it and examine the wound.  A small amount of post-operative bleeding is normal.  If excessive bleeding occurs, remove the bandage, place gauze over the site and apply continuous pressure (no peeking) over the area for 30 minutes. If this does not work, please call our clinic as soon as possible or page your doctor if it is after hours.   2. Once a day, cleanse the wound with soap and water. It is fine to shower. If a thick crust develops you may use a Q-tip dipped into dilute hydrogen peroxide (mix 1:1 with water) to dissolve it.  Hydrogen peroxide can slow the healing process, so use it only as needed.    3. After washing, apply petroleum jelly (Vaseline) or an antibiotic ointment if your doctor prescribed one for you, followed by a bandage.    4. For best healing, the wound should be covered with a layer of ointment at all times. If you are not able to keep the area covered with a bandage to hold the ointment in place, this may mean re-applying the ointment several times a day.  Continue this wound care   until the wound has healed and is no longer open. It may take several weeks for the wound to heal and close.  Itching and mild discomfort is normal during the healing process.  If you have any discomfort, you can take Tylenol (acetaminophen) or ibuprofen as directed on the bottle. (Please do not take these if you have an allergy to them or cannot take them for another reason).  Some redness, tenderness and white or yellow material in the wound is normal healing.  If the area becomes very sore and red, or develops a thick yellow-green material (pus), it may be infected; please notify us.    Wound healing continues for up to one year following surgery. It is not unusual to  experience pain in the scar from time to time during the interval.  If the pain becomes severe or the scar thickens, you should notify the office.    A slight amount of redness in a scar is expected for the first six months.  After six months, the redness will fade and the scar will soften and fade.  The color difference becomes less noticeable with time.  If there are any problems, return for a post-op surgery check at your earliest convenience.  To improve the appearance of the scar, you can use silicone scar gel, cream, or sheets (such as Mederma or Serica) every night for up to one year. These are available over the counter (without a prescription).  Please call our office at [phone] for any questions or concerns.  

## 2020-04-29 ENCOUNTER — Encounter: Payer: Self-pay | Admitting: Dermatology

## 2020-05-02 ENCOUNTER — Telehealth: Payer: Self-pay

## 2020-05-02 NOTE — Telephone Encounter (Signed)
Left message on voicemail to return my call.  

## 2020-05-02 NOTE — Telephone Encounter (Signed)
-----   Message from Ralene Bathe, MD sent at 04/29/2020  2:52 PM EDT ----- Skin , left mid lat pretibial BASAL CELL CARCINOMA, NODULAR PATTERN  Cancer - BCC Already treated Recheck next visit

## 2020-05-02 NOTE — Telephone Encounter (Signed)
Discussed biopsy results with pt  °

## 2020-10-03 ENCOUNTER — Other Ambulatory Visit: Payer: Self-pay

## 2020-10-03 ENCOUNTER — Encounter: Payer: Self-pay | Admitting: Dermatology

## 2020-10-03 ENCOUNTER — Ambulatory Visit (INDEPENDENT_AMBULATORY_CARE_PROVIDER_SITE_OTHER): Payer: Medicare Other | Admitting: Dermatology

## 2020-10-03 DIAGNOSIS — L821 Other seborrheic keratosis: Secondary | ICD-10-CM

## 2020-10-03 DIAGNOSIS — L719 Rosacea, unspecified: Secondary | ICD-10-CM | POA: Diagnosis not present

## 2020-10-03 DIAGNOSIS — D229 Melanocytic nevi, unspecified: Secondary | ICD-10-CM

## 2020-10-03 DIAGNOSIS — L578 Other skin changes due to chronic exposure to nonionizing radiation: Secondary | ICD-10-CM

## 2020-10-03 DIAGNOSIS — Z1283 Encounter for screening for malignant neoplasm of skin: Secondary | ICD-10-CM | POA: Diagnosis not present

## 2020-10-03 DIAGNOSIS — L820 Inflamed seborrheic keratosis: Secondary | ICD-10-CM | POA: Diagnosis not present

## 2020-10-03 DIAGNOSIS — L814 Other melanin hyperpigmentation: Secondary | ICD-10-CM

## 2020-10-03 DIAGNOSIS — Z85828 Personal history of other malignant neoplasm of skin: Secondary | ICD-10-CM | POA: Diagnosis not present

## 2020-10-03 DIAGNOSIS — D1800 Hemangioma unspecified site: Secondary | ICD-10-CM

## 2020-10-03 NOTE — Progress Notes (Signed)
Follow-Up Visit   Subjective  Stephen Mahoney is a 74 y.o. male who presents for the following: Annual Exam (Pt presents for TBSE, hx of BCC ) and Rosacea (check face Rosacea treating with Doxycyline 20 mg bid ). The patient presents for Total-Body Skin Exam (TBSE) for skin cancer screening and mole check.  The following portions of the chart were reviewed this encounter and updated as appropriate:  Allergies  Meds  Problems  Med Hx  Surg Hx  Fam Hx     Review of Systems:  No other skin or systemic complaints except as noted in HPI or Assessment and Plan.  Objective  Well appearing patient in no apparent distress; mood and affect are within normal limits.  A full examination was performed including scalp, head, eyes, ears, nose, lips, neck, chest, axillae, abdomen, back, buttocks, bilateral upper extremities, bilateral lower extremities, hands, feet, fingers, toes, fingernails, and toenails. All findings within normal limits unless otherwise noted below.  Objective  Left pretibial: Well healed scar with no evidence of recurrence.   Objective  Left mid back: Erythematous keratotic or waxy stuck-on papule or plaque.   Objective  Head - Anterior (Face): Mid face erythema with telangiectasias +/- scattered inflammatory papules.    Assessment & Plan  History of basal cell carcinoma (BCC) Left pretibial  Clear. Observe for recurrence. Call clinic for new or changing lesions.  Recommend regular skin exams, daily broad-spectrum spf 30+ sunscreen use, and photoprotection.      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 Pt declines more aggressive treatment.  Recommend laser for vessels of nose. Face and scalp Several papules nose and scalp Rosacea flare today, chronic.  Rosacea is a chronic progressive skin condition usually affecting the face of adults. It is treatable but not curable. It sometimes affects the eyes (ocular rosacea) as well. It may respond to topical and/or systemic medication and can flare with stress, sun exposure, alcohol, exercise and some foods.   Cont Doxycyline 20 mg bid with food   Cosmetic laser treatment for Rosacea a option  Other Related Medications doxycycline (PERIOSTAT) 20 MG tablet   Lentigines - Scattered tan macules - Discussed due to sun exposure - Benign, observe - Call for any changes  Seborrheic Keratoses - Stuck-on, waxy, tan-brown papules and plaques  - Discussed benign etiology and prognosis. - Observe - Call for any changes  Melanocytic Nevi - Tan-brown and/or pink-flesh-colored symmetric macules and papules - Benign appearing on exam today - Observation - Call clinic for new or changing moles - Recommend daily use of broad spectrum spf 30+ sunscreen to sun-exposed areas.   Hemangiomas - Red papules - Discussed benign nature - Observe - Call for any changes  Actinic Damage - Chronic, secondary to cumulative UV/sun exposure - diffuse scaly erythematous macules with underlying dyspigmentation - Recommend daily broad spectrum sunscreen SPF 30+ to sun-exposed areas, reapply every 2 hours as needed.  - Call for new or changing lesions.  Skin cancer screening performed today.  Return in about 6 months (around 04/02/2021).  IMarye Round, CMA, am acting as scribe for Sarina Ser, MD .  Documentation: I have reviewed the above documentation for accuracy and completeness, and I agree with  the above.  Sarina Ser, MD

## 2020-10-03 NOTE — Patient Instructions (Signed)
Cryotherapy Aftercare  . Wash gently with soap and water everyday.   . Apply Vaseline and Band-Aid daily until healed.  

## 2020-10-04 ENCOUNTER — Encounter: Payer: Self-pay | Admitting: Dermatology

## 2020-10-25 ENCOUNTER — Other Ambulatory Visit: Payer: Self-pay | Admitting: Dermatology

## 2020-10-25 DIAGNOSIS — L719 Rosacea, unspecified: Secondary | ICD-10-CM

## 2021-04-12 ENCOUNTER — Ambulatory Visit (INDEPENDENT_AMBULATORY_CARE_PROVIDER_SITE_OTHER): Payer: Medicare Other | Admitting: Dermatology

## 2021-04-12 ENCOUNTER — Encounter: Payer: Self-pay | Admitting: Dermatology

## 2021-04-12 ENCOUNTER — Other Ambulatory Visit: Payer: Self-pay

## 2021-04-12 DIAGNOSIS — L578 Other skin changes due to chronic exposure to nonionizing radiation: Secondary | ICD-10-CM | POA: Diagnosis not present

## 2021-04-12 DIAGNOSIS — D485 Neoplasm of uncertain behavior of skin: Secondary | ICD-10-CM | POA: Diagnosis not present

## 2021-04-12 DIAGNOSIS — D492 Neoplasm of unspecified behavior of bone, soft tissue, and skin: Secondary | ICD-10-CM

## 2021-04-12 DIAGNOSIS — Z85828 Personal history of other malignant neoplasm of skin: Secondary | ICD-10-CM

## 2021-04-12 DIAGNOSIS — Z1283 Encounter for screening for malignant neoplasm of skin: Secondary | ICD-10-CM

## 2021-04-12 DIAGNOSIS — L821 Other seborrheic keratosis: Secondary | ICD-10-CM

## 2021-04-12 DIAGNOSIS — L719 Rosacea, unspecified: Secondary | ICD-10-CM | POA: Diagnosis not present

## 2021-04-12 DIAGNOSIS — D1800 Hemangioma unspecified site: Secondary | ICD-10-CM

## 2021-04-12 DIAGNOSIS — D229 Melanocytic nevi, unspecified: Secondary | ICD-10-CM

## 2021-04-12 DIAGNOSIS — L814 Other melanin hyperpigmentation: Secondary | ICD-10-CM

## 2021-04-12 MED ORDER — DOXYCYCLINE HYCLATE 20 MG PO TABS
ORAL_TABLET | ORAL | 6 refills | Status: DC
Start: 2021-04-12 — End: 2021-10-12

## 2021-04-12 NOTE — Progress Notes (Signed)
Follow-Up Visit   Subjective  Stephen Mahoney is a 75 y.o. male who presents for the following: Upper body skin exam (Hx of BCCs) and dry crusty spot (Nose x 3 weeks). The patient presents for Upper Body Skin Exam (UBSE) for skin cancer screening and mole check.  The following portions of the chart were reviewed this encounter and updated as appropriate:   Allergies  Meds  Problems  Med Hx  Surg Hx  Fam Hx      The patient presents for Upper Body Skin Exam (UBSE) for skin cancer screening and mole check.  Review of Systems:  No other skin or systemic complaints except as noted in HPI or Assessment and Plan.  Objective  Well appearing patient in no apparent distress; mood and affect are within normal limits.  All skin waist up examined.  Objective  multiple: Well healed scar with no evidence of recurrence.   Objective  face: Face with erythema and telangiectasias   Objective  R nose: 1.0cm crusted pap        Assessment & Plan    Lentigines - Scattered tan macules - Due to sun exposure - Benign-appering, observe - Recommend daily broad spectrum sunscreen SPF 30+ to sun-exposed areas, reapply every 2 hours as needed. - Call for any changes  Seborrheic Keratoses - Stuck-on, waxy, tan-brown papules and/or plaques  - Benign-appearing - Discussed benign etiology and prognosis. - Observe - Call for any changes  Melanocytic Nevi - Tan-brown and/or pink-flesh-colored symmetric macules and papules - Benign appearing on exam today - Observation - Call clinic for new or changing moles - Recommend daily use of broad spectrum spf 30+ sunscreen to sun-exposed areas.   Hemangiomas - Red papules - Discussed benign nature - Observe - Call for any changes  Actinic Damage - Chronic condition, secondary to cumulative UV/sun exposure - diffuse scaly erythematous macules with underlying dyspigmentation - Recommend daily broad spectrum sunscreen SPF 30+ to  sun-exposed areas, reapply every 2 hours as needed.  - Staying in the shade or wearing long sleeves, sun glasses (UVA+UVB protection) and wide brim hats (4-inch brim around the entire circumference of the hat) are also recommended for sun protection.  - Call for new or changing lesions.  Skin cancer screening performed today.  History of basal cell carcinoma (BCC) multiple  Clear. Observe for recurrence. Call clinic for new or changing lesions.  Recommend regular skin exams, daily broad-spectrum spf 30+ sunscreen use, and photoprotection.     Rosacea face With Rhinophyma of nose Chronic Rosacea is a chronic progressive skin condition usually affecting the face of adults, causing redness and/or acne bumps. It is treatable but not curable. It sometimes affects the eyes (ocular rosacea) as well. It may respond to topical and/or systemic medication and can flare with stress, sun exposure, alcohol, exercise and some foods.  Daily application of broad spectrum spf 30+ sunscreen to face is recommended to reduce flares.   Cont Doxycycline 20mg   1po bid with food and drink  Will prescribe [facility] metronidazole/ivermectin/azelaic acid twice daily as needed to affected areas on the face. The patient was advised this is not covered by insurance since it is made by a compounding pharmacy. They will receive an email to check out and the medication will be mailed to their home.   Discussed BBL  Doxycycline should be taken with food to prevent nausea. Do not lay down for 30 minutes after taking. Be cautious with sun exposure and use good sun  protection while on this medication. Pregnant women should not take this medication. Reordered Medications doxycycline (PERIOSTAT) 20 MG tablet  Neoplasm of skin R nose Skin / nail biopsy Type of biopsy: tangential   Informed consent: discussed and consent obtained   Timeout: patient name, date of birth, surgical site, and procedure verified   Procedure  prep:  Patient was prepped and draped in usual sterile fashion Prep type:  Isopropyl alcohol Anesthesia: the lesion was anesthetized in a standard fashion   Anesthetic:  1% lidocaine w/ epinephrine 1-100,000 buffered w/ 8.4% NaHCO3 Instrument used: flexible razor blade   Outcome: patient tolerated procedure well   Post-procedure details: sterile dressing applied and wound care instructions given   Dressing type: bandage and bacitracin    Specimen 1 - Surgical pathology Differential Diagnosis: D48.5 R/O BCC  Check Margins: No 1.0cm crusted pap  Skin cancer screening  Return in about 6 months (around 10/13/2021) for TBSE, Hx of BCC, Hx of AKs, Rosacea.   I, Othelia Pulling, RMA, am acting as scribe for Sarina Ser, MD .  Documentation: I have reviewed the above documentation for accuracy and completeness, and I agree with the above.  Sarina Ser, MD

## 2021-04-12 NOTE — Patient Instructions (Addendum)
If you have any questions or concerns for your doctor, please call our main line at [phone] and press option 4 to reach your doctor's medical assistant. If no one answers, please leave a voicemail as directed and we will return your call as soon as possible. Messages left after 4 pm will be answered the following business day.   You may also send us a message via MyChart. We typically respond to MyChart messages within 1-2 business days.  For prescription refills, please ask your pharmacy to contact our office. Our fax number is [phone].  If you have an urgent issue when the clinic is closed that cannot wait until the next business day, you can page your doctor at the number below.    Please note that while we do our best to be available for urgent issues outside of office hours, we are not available 24/7.   If you have an urgent issue and are unable to reach us, you may choose to seek medical care at your doctor's office, retail clinic, urgent care center, or emergency room.  If you have a medical emergency, please immediately call 911 or go to the emergency department.  Pager Numbers  - Dr. Kowalski: [phone]  - Dr. Moye: [phone]  - Dr. Stewart: [phone]  In the event of inclement weather, please call our main line at [phone] for an update on the status of any delays or closures.  Dermatology Medication Tips: Please keep the boxes that topical medications come in in order to help keep track of the instructions about where and how to use these. Pharmacies typically print the medication instructions only on the boxes and not directly on the medication tubes.   If your medication is too expensive, please contact our office at [phone] option 4 or send us a message through MyChart.   We are unable to tell what your co-pay for medications will be in advance as this is different depending on your insurance coverage. However, we may be able to find a  substitute medication at lower cost or fill out paperwork to get insurance to cover a needed medication.   If a prior authorization is required to get your medication covered by your insurance company, please allow us 1-2 business days to complete this process.  Drug prices often vary depending on where the prescription is filled and some pharmacies may offer cheaper prices.  The website www.goodrx.com contains coupons for medications through different pharmacies. The prices here do not account for what the cost may be with help from insurance (it may be cheaper with your insurance), but the website can give you the price if you did not use any insurance.  - You can print the associated coupon and take it with your prescription to the pharmacy.  - You may also stop by our office during regular business hours and pick up a GoodRx coupon card.  - If you need your prescription sent electronically to a different pharmacy, notify our office through [facility] MyChart or by phone at [phone] option 4.     Wound Care Instructions  1. Cleanse wound gently with soap and water once a day then pat dry with clean gauze. Apply a thing coat of Petrolatum (petroleum jelly, "Vaseline") over the wound (unless you have an allergy to this). We recommend that you use a new, sterile tube of Vaseline. Do not pick or remove scabs. Do not remove the yellow or white "healing tissue" from the base of the wound.    Cover the wound with fresh, clean, nonstick gauze and secure with paper tape. You may use Band-Aids in place of gauze and tape if the would is small enough, but would recommend trimming much of the tape off as there is often too much. Sometimes Band-Aids can irritate the skin.  3. You should call the office for your biopsy report after 1 week if you have not already been contacted.  4. If you experience any problems, such as abnormal amounts of bleeding, swelling, significant bruising, significant pain, or evidence of  infection, please call the office immediately.  5. FOR ADULT SURGERY PATIENTS: If you need something for pain relief you may take 1 extra strength Tylenol (acetaminophen) AND 2 Ibuprofen (200mg  each) together every 4 hours as needed for pain. (do not take these if you are allergic to them or if you have a reason you should not take them.) Typically, you may only need pain medication for 1 to 3 days.      Instructions for [facility] Medications  One or more of your medications was sent to the [facility] mail order compounding pharmacy. You will receive an email from them and can purchase the medicine through that link. It will then be mailed to your home at the address you confirmed. If for any reason you do not receive an email from them, please check your spam folder. If you still do not find the email, please let us know. [facility] phone number is [phone].

## 2021-04-18 ENCOUNTER — Telehealth: Payer: Self-pay

## 2021-04-18 NOTE — Telephone Encounter (Signed)
-----   Message from Ralene Bathe, MD sent at 04/18/2021  9:40 AM EDT ----- Diagnosis Skin , right nose SEBORRHEIC DERMATITIS, TRAUMATIZED, WITH HEMORRHAGIC SCALE CRUST  Benign inflamed scaly rash with scab No further treatment needed Recheck next visit

## 2021-04-18 NOTE — Telephone Encounter (Signed)
Discussed biopsy results with pt  °

## 2021-04-21 ENCOUNTER — Encounter: Payer: Self-pay | Admitting: Dermatology

## 2021-04-27 ENCOUNTER — Other Ambulatory Visit: Payer: Self-pay | Admitting: Nephrology

## 2021-04-27 DIAGNOSIS — N183 Chronic kidney disease, stage 3 unspecified: Secondary | ICD-10-CM

## 2021-04-27 DIAGNOSIS — R809 Proteinuria, unspecified: Secondary | ICD-10-CM

## 2021-04-27 DIAGNOSIS — E1122 Type 2 diabetes mellitus with diabetic chronic kidney disease: Secondary | ICD-10-CM

## 2021-04-27 DIAGNOSIS — R8290 Unspecified abnormal findings in urine: Secondary | ICD-10-CM

## 2021-05-10 ENCOUNTER — Ambulatory Visit
Admission: RE | Admit: 2021-05-10 | Discharge: 2021-05-10 | Disposition: A | Discharge status: Home / Self Care | Payer: Medicare Other | Source: Ambulatory Visit | Attending: Nephrology | Admitting: Nephrology

## 2021-05-10 ENCOUNTER — Other Ambulatory Visit: Payer: Self-pay

## 2021-05-10 DIAGNOSIS — R809 Proteinuria, unspecified: Secondary | ICD-10-CM | POA: Diagnosis present

## 2021-05-10 DIAGNOSIS — E1122 Type 2 diabetes mellitus with diabetic chronic kidney disease: Secondary | ICD-10-CM | POA: Insufficient documentation

## 2021-05-10 DIAGNOSIS — R8290 Unspecified abnormal findings in urine: Secondary | ICD-10-CM | POA: Diagnosis present

## 2021-05-10 DIAGNOSIS — N183 Chronic kidney disease, stage 3 unspecified: Secondary | ICD-10-CM | POA: Insufficient documentation

## 2021-10-12 ENCOUNTER — Other Ambulatory Visit: Payer: Self-pay

## 2021-10-12 ENCOUNTER — Ambulatory Visit (INDEPENDENT_AMBULATORY_CARE_PROVIDER_SITE_OTHER): Payer: Medicare Other | Admitting: Dermatology

## 2021-10-12 DIAGNOSIS — D229 Melanocytic nevi, unspecified: Secondary | ICD-10-CM

## 2021-10-12 DIAGNOSIS — Z85828 Personal history of other malignant neoplasm of skin: Secondary | ICD-10-CM

## 2021-10-12 DIAGNOSIS — L820 Inflamed seborrheic keratosis: Secondary | ICD-10-CM | POA: Diagnosis not present

## 2021-10-12 DIAGNOSIS — D1801 Hemangioma of skin and subcutaneous tissue: Secondary | ICD-10-CM

## 2021-10-12 DIAGNOSIS — Z1283 Encounter for screening for malignant neoplasm of skin: Secondary | ICD-10-CM

## 2021-10-12 DIAGNOSIS — L719 Rosacea, unspecified: Secondary | ICD-10-CM

## 2021-10-12 DIAGNOSIS — L814 Other melanin hyperpigmentation: Secondary | ICD-10-CM

## 2021-10-12 DIAGNOSIS — D3611 Benign neoplasm of peripheral nerves and autonomic nervous system of face, head, and neck: Secondary | ICD-10-CM | POA: Diagnosis not present

## 2021-10-12 DIAGNOSIS — D3610 Benign neoplasm of peripheral nerves and autonomic nervous system, unspecified: Secondary | ICD-10-CM

## 2021-10-12 DIAGNOSIS — L821 Other seborrheic keratosis: Secondary | ICD-10-CM

## 2021-10-12 DIAGNOSIS — L578 Other skin changes due to chronic exposure to nonionizing radiation: Secondary | ICD-10-CM

## 2021-10-12 DIAGNOSIS — L711 Rhinophyma: Secondary | ICD-10-CM

## 2021-10-12 MED ORDER — DOXYCYCLINE HYCLATE 20 MG PO TABS: ORAL_TABLET | ORAL | 11 refills | Status: DC

## 2021-10-12 NOTE — Progress Notes (Signed)
Follow-Up Visit   Subjective  Stephen Mahoney is a 75 y.o. male who presents for the following: Rosacea (Face, Doxycycline 20mg  1 po bid, SM triple cream prn) and Total body skin exam (Hx of BCCs). The patient presents for Total-Body Skin Exam (TBSE) for skin cancer screening and mole check. The patient has spots, moles and lesions to be evaluated, some may be new or changing and the patient has concerns that these could be cancer.  The following portions of the chart were reviewed this encounter and updated as appropriate:   Tobacco  Allergies  Meds  Problems  Med Hx  Surg Hx  Fam Hx     Review of Systems:  No other skin or systemic complaints except as noted in HPI or Assessment and Plan.  Objective  Well appearing patient in no apparent distress; mood and affect are within normal limits.  A full examination was performed including scalp, head, eyes, ears, nose, lips, neck, chest, axillae, abdomen, back, buttocks, bilateral upper extremities, bilateral lower extremities, hands, feet, fingers, toes, fingernails, and toenails. All findings within normal limits unless otherwise noted below.  Right mid to distal dorsum nose, Left medial pectoral, Left mid lat. pretibial Well healed scars with no evidence of recurrence.   back x 25, face x 1 Total = 26 (25) Erythematous keratotic or waxy stuck-on papule or plaque.   L neck Flesh pap  Head - Anterior (Face) Few paps face, telangiectasias face, rhinophyma nose   Assessment & Plan   Lentigines - Scattered tan macules - Due to sun exposure - Benign-appearing, observe - Recommend daily broad spectrum sunscreen SPF 30+ to sun-exposed areas, reapply every 2 hours as needed. - Call for any changes  Seborrheic Keratoses - Stuck-on, waxy, tan-brown papules and/or plaques  - Benign-appearing - Discussed benign etiology and prognosis. - Observe - Call for any changes  Melanocytic Nevi - Tan-brown and/or pink-flesh-colored  symmetric macules and papules - Benign appearing on exam today - Observation - Call clinic for new or changing moles - Recommend daily use of broad spectrum spf 30+ sunscreen to sun-exposed areas.   Hemangiomas - Red papules - Discussed benign nature - Observe - Call for any changes  Actinic Damage - Chronic condition, secondary to cumulative UV/sun exposure - diffuse scaly erythematous macules with underlying dyspigmentation - Recommend daily broad spectrum sunscreen SPF 30+ to sun-exposed areas, reapply every 2 hours as needed.  - Staying in the shade or wearing long sleeves, sun glasses (UVA+UVB protection) and wide brim hats (4-inch brim around the entire circumference of the hat) are also recommended for sun protection.  - Call for new or changing lesions.  Skin cancer screening performed today.  History of basal cell carcinoma (BCC) Right mid to distal dorsum nose, Left medial pectoral, Left mid lat. pretibial Clear. Observe for recurrence. Call clinic for new or changing lesions.  Recommend regular skin exams, daily broad-spectrum spf 30+ sunscreen use, and photoprotection.    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 It is treatable but not curable. It sometimes  affects the eyes (ocular rosacea) as well. It may respond to topical and/or systemic medication and can flare with stress, sun exposure, alcohol, exercise and some foods.  Daily application of broad spectrum spf 30+ sunscreen to face is recommended to reduce flares.  Cont Doxycycline 20mg  1 po bid with food and drink Cont [facility] Triple cream qhs  Discussed BBL $350 per treatment  Related Medications doxycycline (PERIOSTAT) 20 MG tablet Take with food and drink  Skin cancer screening  Return in about 1 year (around 10/12/2022) for Hx of BCC.  I, Othelia Pulling, RMA, am acting as scribe for Sarina Ser, MD . Documentation: I have reviewed the above documentation for accuracy and completeness, and I agree with the above.  Sarina Ser, MD

## 2021-10-12 NOTE — Patient Instructions (Addendum)
If You Need Anything After Your Visit  If you have any questions or concerns for your doctor, please call our main line at [phone] and press option 4 to reach your doctor's medical assistant. If no one answers, please leave a voicemail as directed and we will return your call as soon as possible. Messages left after 4 pm will be answered the following business day.   You may also send us a message via MyChart. We typically respond to MyChart messages within 1-2 business days.  For prescription refills, please ask your pharmacy to contact our office. Our fax number is [phone].  If you have an urgent issue when the clinic is closed that cannot wait until the next business day, you can page your doctor at the number below.    Please note that while we do our best to be available for urgent issues outside of office hours, we are not available 24/7.   If you have an urgent issue and are unable to reach us, you may choose to seek medical care at your doctor's office, retail clinic, urgent care center, or emergency room.  If you have a medical emergency, please immediately call 911 or go to the emergency department.  Pager Numbers  - Dr. Kowalski: [phone]  - Dr. Moye: [phone]  - Dr. Stewart: [phone]  In the event of inclement weather, please call our main line at [phone] for an update on the status of any delays or closures.  Dermatology Medication Tips: Please keep the boxes that topical medications come in in order to help keep track of the instructions about where and how to use these. Pharmacies typically print the medication instructions only on the boxes and not directly on the medication tubes.   If your medication is too expensive, please contact our office at [phone] option 4 or send us a message through MyChart.   We are unable to tell what your co-pay for medications will be in advance as this is different depending on your insurance coverage.  However, we may be able to find a substitute medication at lower cost or fill out paperwork to get insurance to cover a needed medication.   If a prior authorization is required to get your medication covered by your insurance company, please allow us 1-2 business days to complete this process.  Drug prices often vary depending on where the prescription is filled and some pharmacies may offer cheaper prices.  The website www.goodrx.com contains coupons for medications through different pharmacies. The prices here do not account for what the cost may be with help from insurance (it may be cheaper with your insurance), but the website can give you the price if you did not use any insurance.  - You can print the associated coupon and take it with your prescription to the pharmacy.  - You may also stop by our office during regular business hours and pick up a GoodRx coupon card.  - If you need your prescription sent electronically to a different pharmacy, notify our office through [place] MyChart or by phone at [phone] option 4.     Si Usted Necesita Algo Despus de Su Visita  Tambin puede enviarnos un mensaje a travs de MyChart. Por lo general respondemos a los mensajes de MyChart en el transcurso de 1 a 2 das hbiles.  Para renovar recetas, por favor pida a su farmacia que se ponga en contacto con nuestra oficina. Nuestro nmero de fax es el [phone].  Si tiene   un asunto urgente cuando la clnica est cerrada y que no puede esperar hasta el siguiente da hbil, puede llamar/localizar a su doctor(a) al nmero que aparece a continuacin.   Por favor, tenga en cuenta que aunque hacemos todo lo posible para estar disponibles para asuntos urgentes fuera del horario de [place], no estamos disponibles las 24 horas del da, los 7 das de la [place].   Si tiene un problema urgente y no puede comunicarse con nosotros, puede optar por buscar atencin mdica  en el consultorio de su  doctor(a), en una clnica privada, en un centro de atencin urgente o en una sala de emergencias.  Si tiene Engineering geologist, por favor llame inmediatamente al 911 o vaya a la sala de emergencias.  Nmeros de bper  - Dr. Nehemiah Massed: [phone]  - Dra. Moye: [phone]  - Dra. Nicole Kindred: [phone]  En caso de inclemencias del [place], por favor llame a Johnsie Kindred principal al [phone] para una actualizacin sobre el [place] de cualquier retraso o cierre.  Consejos para la medicacin en dermatologa: Por favor, guarde las cajas en las que vienen los medicamentos de uso tpico para ayudarle a seguir las instrucciones sobre dnde y cmo usarlos. Las farmacias generalmente imprimen las instrucciones del medicamento slo en las cajas y no directamente en los tubos del [place].   Si su medicamento es muy caro, por favor, pngase en contacto con Zigmund Daniel llamando al [phone] y presione la opcin 4 o envenos un mensaje a travs de Pharmacist, community.   No podemos decirle cul ser su copago por los medicamentos por adelantado ya que esto es diferente dependiendo de la cobertura de su seguro. Sin embargo, es posible que podamos encontrar un medicamento sustituto a Electrical engineer un formulario para que el seguro cubra el medicamento que se considera necesario.   Si se requiere una autorizacin previa para que su compaa de seguros [place] su medicamento, por favor permtanos de 1 a 2 das hbiles para completar este proceso.  Los precios de los medicamentos varan con frecuencia dependiendo del Environmental consultant de dnde se surte la receta y alguna farmacias pueden ofrecer precios ms baratos.  El sitio web www.goodrx.com tiene cupones para medicamentos de Airline pilot. Los precios aqu no tienen en cuenta lo que podra costar con la ayuda del seguro (puede ser ms barato con su seguro), pero el sitio web puede darle el precio si no utiliz Research scientist (physical sciences).  - Puede imprimir el cupn  correspondiente y llevarlo con su receta a la farmacia.  - Tambin puede pasar por nuestra oficina durante el horario de atencin regular y Charity fundraiser una tarjeta de cupones de GoodRx.  - Si necesita que su receta se enve electrnicamente a una farmacia diferente, informe a nuestra oficina a travs de MyChart de [place] o por telfono llamando al [phone] y presione la opcin 4.   Instructions for [facility] Medications  One or more of your medications was sent to the [facility] mail order compounding pharmacy. You will receive an email from them and can purchase the medicine through that link. It will then be mailed to your home at the address you confirmed. If for any reason you do not receive an email from them, please check your spam folder. If you still do not find the email, please let us know. [facility] phone number is [phone].

## 2021-10-27 ENCOUNTER — Encounter: Payer: Self-pay | Admitting: Dermatology

## 2022-01-31 ENCOUNTER — Encounter: Payer: Self-pay | Admitting: Gastroenterology

## 2022-01-31 NOTE — H&P (Signed)
? ?Pre-Procedure H&P ?  ?Patient ID: Stephen Mahoney is a 76 y.o. male. ? ?Gastroenterology Provider: Annamaria Helling, DO ? ?Referring Provider: Octavia Bruckner, PA ?PCP: Dion Body, MD ? ?Date: 02/01/2022 ? ?HPI ?[name] is a 76 y.o. male who presents today for Colonoscopy for Surveillance-personal history of colon polyps (adenomatous and hyperplastic). ?Patient with personal history of colon polyps including adenomatous polyps.  Last colonoscopy in 2017 demonstrated this along with left-sided diverticulosis and internal hemorrhoids.  No family history of colorectal cancer or polyps. ?Currently with normal BMs 1-2 times a day without melena hematochezia diarrhea or constipation ?Most recent lab work hemoglobin 16.3 MCV 90 platelets 247,000 creatinine 1.6 ?25-pack-year history, but has quit tobacco sometime ago ? ?Past Medical History:  ?Diagnosis Date  ? Actinic keratosis   ? Basal cell carcinoma 05/07/2013  ? Right mid to distal dorsum nose.   ? Basal cell carcinoma 01/15/2018  ? Left medial pectoral. Nodular, ulcerated.  ? Basal cell carcinoma 04/21/2020  ? Left mid lat. pretibial. Nodular. EDC.  ? BPH (benign prostatic hyperplasia)   ? Chronic kidney disease   ? Chronic kidney disease   ? Diabetes mellitus without complication ([place])   ? High blood triglycerides   ? Hyperlipidemia with low HDL   ? Hypertension   ? Obesity   ? ? ?Past Surgical History:  ?Procedure Laterality Date  ? COLON SURGERY    ? COLONOSCOPY W/ POLYPECTOMY    ? COLONOSCOPY WITH PROPOFOL N/A 04/12/2016  ? Procedure: COLONOSCOPY WITH PROPOFOL;  Surgeon: Manya Silvas, MD;  Location[facility] END[facility]COPY;  Service: Endoscopy;  Laterality: N/A;  ? MM BCCCP - DIAG MAMMO W/CAD ([place] [facility]) N/A   ? left lip nose  ? TONSILLECTOMY    ? ? ?Family History ?No h/o GI disease or malignancy ? ?Review of Systems  ?Constitutional:  Negative for activity change, appetite change, chills, diaphoresis, fatigue, fever and unexpected weight change.   ?HENT:  Negative for trouble swallowing and voice change.   ?Respiratory:  Negative for shortness of breath and wheezing.   ?Cardiovascular:  Negative for chest pain, palpitations and leg swelling.  ?Gastrointestinal:  Negative for abdominal distention, abdominal pain, anal bleeding, blood in stool, constipation, diarrhea, nausea and vomiting.  ?Musculoskeletal:  Negative for arthralgias and myalgias.  ?Skin:  Negative for color change and pallor.  ?Neurological:  Negative for dizziness, syncope and weakness.  ?Psychiatric/Behavioral:  Negative for confusion. The patient is not nervous/anxious.   ?All other systems reviewed and are negative.  ? ?Medications ?No current facility-administered medications on file prior to encounter.  ? ?Current Outpatient Medications on File Prior to Encounter  ?Medication Sig Dispense Refill  ? amLODipine (NORVASC) 5 MG tablet Take 5 mg by mouth daily.    ? Dulaglutide (TRULICITY) 3 JO/8.7OM SOPN Inject 3 mg into the skin.    ? Dutasteride-Tamsulosin HCl 0.5-0.4 MG CAPS Take by mouth.    ? glimepiride (AMARYL) 4 MG tablet Take 4 mg by mouth daily with breakfast.    ? lovastatin (MEVACOR) 20 MG tablet Take 20 mg by mouth at bedtime.    ? metFORMIN (GLUCOPHAGE) 500 MG tablet Take by mouth 2 (two) times daily with a meal.    ? tamsulosin (FLOMAX) 0.4 MG CAPS capsule Take 0.4 mg by mouth.    ? traZODone (DESYREL) 100 MG tablet Take 100 mg by mouth at bedtime.    ? valsartan-hydrochlorothiazide (DIOVAN-HCT) 320-25 MG tablet Take 1 tablet by mouth daily.    ?  aspirin 81 MG tablet Take 81 mg by mouth daily. (Patient not taking: Reported on 02/01/2022)    ? glucose blood test strip 1 each by Other route as needed for other. Use as instructed    ? Liraglutide (VICTOZA [place]) Inject into the skin. (Patient not taking: Reported on 02/01/2022)    ? ? ?Pertinent medications related to GI and procedure were reviewed by me with the patient prior to the procedure ? ? ?Current Facility-Administered  Medications:  ?  0.9 %  sodium chloride infusion, , Intravenous, Continuous, Annamaria Helling, DO ?  ?  ? ?Allergies  ?Allergen Reactions  ? Sulfa Antibiotics Swelling and Rash  ? ?Allergies were reviewed by me prior to the procedure ? ?Objective  ? ? ?Vitals:  ? 02/01/22 0719  ?BP: (!) 145/76  ?Pulse: (!) 50  ?Resp: 18  ?Temp: 97.7 ?F (36.5 ?C)  ?TempSrc: Temporal  ?SpO2: 97%  ?Weight: 136.1 kg  ?Height: '6\' 2"'$  (1.88 m)  ? ? ? ?Physical Exam ?Vitals and nursing note reviewed.  ?Constitutional:   ?   General: He is not in acute distress. ?   Appearance: Normal appearance. He is obese. He is not ill-appearing, toxic-appearing or diaphoretic.  ?HENT:  ?   Head: Normocephalic and atraumatic.  ?   Nose: Nose normal.  ?   Mouth/Throat:  ?   Mouth: Mucous membranes are moist.  ?   Pharynx: Oropharynx is clear.  ?Eyes:  ?   General: No scleral icterus. ?   Extraocular Movements: Extraocular movements intact.  ?Cardiovascular:  ?   Rate and Rhythm: Regular rhythm. Bradycardia present.  ?   Heart sounds: Normal heart sounds. No murmur heard. ?  No friction rub. No gallop.  ?Pulmonary:  ?   Effort: Pulmonary effort is normal. No respiratory distress.  ?   Breath sounds: Normal breath sounds. No wheezing, rhonchi or rales.  ?Abdominal:  ?   General: Bowel sounds are normal. There is no distension.  ?   Palpations: Abdomen is soft.  ?   Tenderness: There is no abdominal tenderness. There is no guarding or rebound.  ?Musculoskeletal:  ?   Cervical back: Neck supple.  ?   Right lower leg: No edema.  ?   Left lower leg: No edema.  ?Skin: ?   General: Skin is warm and dry.  ?   Coloration: Skin is not jaundiced or pale.  ?Neurological:  ?   General: No focal deficit present.  ?   Mental Status: He is alert and oriented to person, place, and time. Mental status is at baseline.  ?Psychiatric:     ?   Mood and Affect: Mood normal.     ?   Behavior: Behavior normal.     ?   Thought Content: Thought content normal.     ?   Judgment:  Judgment normal.  ? ? ? ?Assessment:  ?[name] is a 76 y.o. male  who presents today for Colonoscopy for Surveillance-personal history of colon polyps (adenomatous and hyperplastic). ? ?Plan:  ?Colonoscopy with possible intervention today ? ?Colonoscopy with possible biopsy, control of bleeding, polypectomy, and interventions as necessary has been discussed with the patient/patient representative. Informed consent was obtained from the patient/patient representative after explaining the indication, nature, and risks of the procedure including but not limited to death, bleeding, perforation, missed neoplasm/lesions, cardiorespiratory compromise, and reaction to medications. Opportunity for questions was given and appropriate answers were provided. Patient/patient representative has verbalized  understanding is amenable to undergoing the procedure. ? ? ?Annamaria Helling, DO  ?[facility] ? ?Portions of the record may have been created with voice recognition software. Occasional wrong-word or 'sound-a-like' substitutions may have occurred due to the inherent limitations of voice recognition software.  Read the chart carefully and recognize, using context, where substitutions may have occurred. ?

## 2022-02-01 ENCOUNTER — Ambulatory Visit: Payer: Medicare Other | Admitting: Certified Registered Nurse Anesthetist

## 2022-02-01 ENCOUNTER — Other Ambulatory Visit: Payer: Self-pay

## 2022-02-01 ENCOUNTER — Encounter
Admission: RE | Disposition: A | Discharge status: Home / Self Care | Payer: Self-pay | Source: Ambulatory Visit | Attending: Gastroenterology

## 2022-02-01 ENCOUNTER — Encounter: Payer: Self-pay | Admitting: Gastroenterology

## 2022-02-01 ENCOUNTER — Ambulatory Visit
Admission: RE | Admit: 2022-02-01 | Discharge: 2022-02-01 | Disposition: A | Discharge status: Home / Self Care | Payer: Medicare Other | Source: Ambulatory Visit | Attending: Gastroenterology | Admitting: Gastroenterology

## 2022-02-01 DIAGNOSIS — Z6841 Body Mass Index (BMI) 40.0 and over, adult: Secondary | ICD-10-CM | POA: Insufficient documentation

## 2022-02-01 DIAGNOSIS — I129 Hypertensive chronic kidney disease with stage 1 through stage 4 chronic kidney disease, or unspecified chronic kidney disease: Secondary | ICD-10-CM | POA: Insufficient documentation

## 2022-02-01 DIAGNOSIS — D124 Benign neoplasm of descending colon: Secondary | ICD-10-CM | POA: Diagnosis not present

## 2022-02-01 DIAGNOSIS — E1122 Type 2 diabetes mellitus with diabetic chronic kidney disease: Secondary | ICD-10-CM | POA: Insufficient documentation

## 2022-02-01 DIAGNOSIS — Z86010 Personal history of colonic polyps: Secondary | ICD-10-CM | POA: Diagnosis not present

## 2022-02-01 DIAGNOSIS — Z1211 Encounter for screening for malignant neoplasm of colon: Secondary | ICD-10-CM | POA: Insufficient documentation

## 2022-02-01 DIAGNOSIS — Z87891 Personal history of nicotine dependence: Secondary | ICD-10-CM | POA: Insufficient documentation

## 2022-02-01 DIAGNOSIS — K640 First degree hemorrhoids: Secondary | ICD-10-CM | POA: Diagnosis not present

## 2022-02-01 DIAGNOSIS — D122 Benign neoplasm of ascending colon: Secondary | ICD-10-CM | POA: Insufficient documentation

## 2022-02-01 DIAGNOSIS — N189 Chronic kidney disease, unspecified: Secondary | ICD-10-CM | POA: Diagnosis not present

## 2022-02-01 DIAGNOSIS — K5730 Diverticulosis of large intestine without perforation or abscess without bleeding: Secondary | ICD-10-CM | POA: Diagnosis not present

## 2022-02-01 DIAGNOSIS — D125 Benign neoplasm of sigmoid colon: Secondary | ICD-10-CM | POA: Diagnosis not present

## 2022-02-01 HISTORY — PX: COLONOSCOPY: SHX5424

## 2022-02-01 HISTORY — DX: Chronic kidney disease, unspecified: N18.9

## 2022-02-01 LAB — GLUCOSE, CAPILLARY: Glucose-Capillary: 138 mg/dL — ABNORMAL HIGH (ref 70–99)

## 2022-02-01 SURGERY — COLONOSCOPY
Anesthesia: General

## 2022-02-01 MED ORDER — LIDOCAINE HCL (CARDIAC) PF 100 MG/5ML IV SOSY
PREFILLED_SYRINGE | INTRAVENOUS | Status: DC | PRN
  Administered 2022-02-01: 50 mg via INTRAVENOUS

## 2022-02-01 MED ORDER — LIDOCAINE HCL (PF) 2 % IJ SOLN
INTRAMUSCULAR | Status: AC
  Filled 2022-02-01: qty 5

## 2022-02-01 MED ORDER — PROPOFOL 500 MG/50ML IV EMUL
INTRAVENOUS | Status: AC
  Filled 2022-02-01: qty 50

## 2022-02-01 MED ORDER — SODIUM CHLORIDE 0.9 % IV SOLN: INTRAVENOUS | Status: DC

## 2022-02-01 MED ORDER — PHENYLEPHRINE HCL (PRESSORS) 10 MG/ML IV SOLN
INTRAVENOUS | Status: AC
  Filled 2022-02-01: qty 1

## 2022-02-01 MED ORDER — EPHEDRINE SULFATE (PRESSORS) 50 MG/ML IJ SOLN
INTRAMUSCULAR | Status: DC | PRN
  Administered 2022-02-01: 10 mg via INTRAVENOUS

## 2022-02-01 MED ORDER — GLYCOPYRROLATE 0.2 MG/ML IJ SOLN
INTRAMUSCULAR | Status: DC | PRN
  Administered 2022-02-01: .2 mg via INTRAVENOUS

## 2022-02-01 MED ORDER — PROPOFOL 500 MG/50ML IV EMUL
INTRAVENOUS | Status: DC | PRN
  Administered 2022-02-01: 150 ug/kg/min via INTRAVENOUS

## 2022-02-01 MED ORDER — PROPOFOL 10 MG/ML IV BOLUS
INTRAVENOUS | Status: DC | PRN
  Administered 2022-02-01: 70 mg via INTRAVENOUS

## 2022-02-01 MED ORDER — EPHEDRINE 5 MG/ML INJ
INTRAVENOUS | Status: AC
  Filled 2022-02-01: qty 5

## 2022-02-01 NOTE — Anesthesia Procedure Notes (Signed)
Date/Time: 02/01/2022 7:44 AM ?Performed by: Johnna Acosta, CRNA ?Pre-anesthesia Checklist: Patient identified, Emergency Drugs available, Suction available, Patient being monitored and Timeout performed ?Patient Re-evaluated:Patient Re-evaluated prior to induction ?Oxygen Delivery Method: Nasal cannula ?Preoxygenation: Pre-oxygenation with 100% oxygen ?Induction Type: IV induction ? ? ? ? ?

## 2022-02-01 NOTE — Op Note (Signed)
[facility] Gastroenterology Patient Name: Stephen Mahoney Procedure Date: 02/01/2022 7:00 AM MRN: [id number] Account #: [id number] Date of Birth: 07-Jul-1946 Admit Type: Outpatient Age: 76 Room: [facility] ENDO ROOM 1 Gender: Male Note Status: Finalized Instrument Name: Colonoscope [id number] Procedure:             Colonoscopy Indications:           High risk colon cancer surveillance: Personal history                         of colonic polyps Providers:             Rueben Bash, DO Referring MD:          Dion Body (Referring MD) Medicines:             Monitored Anesthesia Care Complications:         No immediate complications. Estimated blood loss:                         Minimal. Procedure:             Pre-Anesthesia Assessment:                        - Prior to the procedure, a History and Physical was                         performed, and patient medications and allergies were                         reviewed. The patient is competent. The risks and                         benefits of the procedure and the sedation options and                         risks were discussed with the patient. All questions                         were answered and informed consent was obtained.                         Patient identification and proposed procedure were                         verified by the physician, the nurse, the anesthetist                         and the technician in the endoscopy suite. Mental                         Status Examination: alert and oriented. Airway                         Examination: normal oropharyngeal airway and neck                         mobility. Respiratory Examination: clear to  auscultation. CV Examination: RRR, no murmurs, no S3                         or S4. Prophylactic Antibiotics: The patient does not                         require prophylactic antibiotics. Prior                          Anticoagulants: The patient has taken no previous                         anticoagulant or antiplatelet agents. ASA Grade                         Assessment: III - A patient with severe systemic                         disease. After reviewing the risks and benefits, the                         patient was deemed in satisfactory condition to                         undergo the procedure. The anesthesia plan was to use                         monitored anesthesia care (MAC). Immediately prior to                         administration of medications, the patient was                         re-assessed for adequacy to receive sedatives. The                         heart rate, respiratory rate, oxygen saturations,                         blood pressure, adequacy of pulmonary ventilation, and                         response to care were monitored throughout the                         procedure. The physical status of the patient was                         re-assessed after the procedure.                        After obtaining informed consent, the colonoscope was                         passed under direct vision. Throughout the procedure,                         the patient's blood pressure, pulse, and oxygen  saturations were monitored continuously. The                         Colonoscope was introduced through the anus and                         advanced to the the terminal ileum, with                         identification of the appendiceal orifice and IC                         valve. The colonoscopy was performed without                         difficulty. The patient tolerated the procedure well.                         The quality of the bowel preparation was evaluated                         using the BBPS [facility] Bowel Preparation Scale) with                         scores of: Right Colon = 2 (minor amount of residual                         staining, small  fragments of stool and/or opaque                         liquid, but mucosa seen well), Transverse Colon = 3                         (entire mucosa seen well with no residual staining,                         small fragments of stool or opaque liquid) and Left                         Colon = 2 (minor amount of residual staining, small                         fragments of stool and/or opaque liquid, but mucosa                         seen well). The total BBPS score equals 7. The quality                         of the bowel preparation was good. The terminal ileum,                         ileocecal valve, appendiceal orifice, and rectum were                         photographed. Findings:      The terminal ileum appeared normal. Estimated blood loss: none.      Scattered small-mouthed diverticula were found  in the entire colon. Most       concentrated in the left colon Estimated blood loss: none.      Non-bleeding internal hemorrhoids were found during retroflexion. The       hemorrhoids were Grade I (internal hemorrhoids that do not prolapse).       Estimated blood loss: none.      A 1 to 2 mm polyp was found in the ascending colon. The polyp was       sessile. The polyp was removed with a cold biopsy forceps. Resection and       retrieval were complete. Estimated blood loss was minimal.      Two sessile polyps were found in the rectum and recto-sigmoid colon. The       polyps were 3 to 5 mm in size. Estimated blood loss was minimal.      A 5 to 7 mm polyp was found in the descending colon. The polyp was       pedunculated. The polyp was removed with a hot snare. Resection and       retrieval were complete. Estimated blood loss was minimal.      The exam was otherwise without abnormality on direct and retroflexion       views. Impression:            - The examined portion of the ileum was normal.                        - Diverticulosis in the entire examined colon.                         - Non-bleeding internal hemorrhoids.                        - One 1 to 2 mm polyp in the ascending colon, removed                         with a cold biopsy forceps. Resected and retrieved.                        - Two 3 to 5 mm polyps in the rectum and at the                         recto-sigmoid colon.                        - One 5 to 7 mm polyp in the descending colon, removed                         with a hot snare. Resected and retrieved.                        - The examination was otherwise normal on direct and                         retroflexion views. Recommendation:        - Discharge patient to home.                        - Resume previous diet.                        -  Continue present medications.                        - No aspirin, ibuprofen, naproxen, or other                         non-steroidal anti-inflammatory drugs for 5 days after                         polyp removal.                        - Await pathology results.                        - Repeat colonoscopy for surveillance based on                         pathology results.                        - Return to referring physician as previously                         scheduled.                        - The findings and recommendations were discussed with                         the patient. Procedure Code(s):     --- Professional ---                        [phone], Colonoscopy, flexible; with removal of                         tumor(s), polyp(s), or other lesion(s) by snare                         technique                        45380, 43, Colonoscopy, flexible; with biopsy, single                         or multiple Diagnosis Code(s):     --- Professional ---                        Z86.010, Personal history of colonic polyps                        K64.0, First degree hemorrhoids                        K63.5, Polyp of colon                        K62.1, Rectal polyp                        K57.30,  Diverticulosis of large intestine without  perforation or abscess without bleeding CPT copyright 2019 American Medical Association. All rights reserved. The codes documented in this report are preliminary and upon coder review may  be revised to meet current compliance requirements. Attending Participation:      I personally performed the entire procedure. Volney American, DO Annamaria Helling DO, DO 02/01/2022 8:30:17 AM This report has been signed electronically. Number of Addenda: 0 Note Initiated On: 02/01/2022 7:00 AM Scope Withdrawal Time: 0 hours 28 minutes 34 seconds  Total Procedure Duration: 0 hours 32 minutes 30 seconds  Estimated Blood Loss:  Estimated blood loss was minimal.      [facility]

## 2022-02-01 NOTE — Anesthesia Preprocedure Evaluation (Signed)
Anesthesia Evaluation  ?Patient identified by MRN, date of birth, ID band ?Patient awake ? ? ? ?Reviewed: ?Allergy & Precautions, H&P , NPO status , Patient's Chart, lab work & pertinent test results ? ?History of Anesthesia Complications ?Negative for: history of anesthetic complications ? ?Airway ?Mallampati: III ? ?TM Distance: >3 FB ?Neck ROM: limited ? ? ? Dental ? ?(+) Poor Dentition, Dental Advidsory Given ?  ?Pulmonary ?neg shortness of breath, neg recent URI, former smoker,  ?  ?Pulmonary exam normal ?breath sounds clear to auscultation ? ? ? ? ? ? Cardiovascular ?Exercise Tolerance: Good ?hypertension, (-) angina(-) Past MI, (-) Cardiac Stents and (-) DOE Normal cardiovascular exam(-) dysrhythmias (-) Valvular Problems/Murmurs ?Rhythm:regular Rate:Normal ? ? ?  ?Neuro/Psych ?negative neurological ROS ? negative psych ROS  ? GI/Hepatic ?negative GI ROS, Neg liver ROS, neg GERD  ,  ?Endo/Other  ?diabetes, Type 2Morbid obesity ? Renal/GU ?CRFRenal disease  ?negative genitourinary ?  ?Musculoskeletal ? ? Abdominal ?  ?Peds ? Hematology ?negative hematology ROS ?(+)   ?Anesthesia Other Findings ?Past Medical History: ?  Hypertension                                               ?  Obesity                                                    ?  BPH (benign prostatic hyperplasia)                         ?  Diabetes mellitus without complication ([place])               ?  High blood triglycerides                                   ?  Hyperlipidemia with low HDL                                ? ?Past Surgical History: ?  COLON SURGERY                                               ?  COLONOSCOPY W/ POLYPECTOMY                                  ?  MM BCCCP - DIAG MAMMO W/CAD ([facility] HX)           N/A            ?    Comment:left lip nose ?  TONSILLECTOMY                                               ? ?BMI   ? Body Mass  Index  ? 40.42 kg/m 2  ?  ?Past Medical History: ?  Hypertension                                                ?  Obesity                                                    ?  BPH (benign prostatic hyperplasia)                         ?  Diabetes mellitus without complication ([place])               ?  High blood triglycerides                                   ?  Hyperlipidemia with low HDL                                ? ? ? Reproductive/Obstetrics ?negative OB ROS ? ?  ? ? ? ? ? ? ? ? ? ? ? ? ? ?  ?  ? ? ? ? ? ? ? ? ?Anesthesia Physical ? ?Anesthesia Plan ? ?ASA: 3 ? ?Anesthesia Plan: General  ? ?Post-op Pain Management:   ? ?Induction: Intravenous ? ?PONV Risk Score and Plan: 2 and Propofol infusion and TIVA ? ?Airway Management Planned: Natural Airway and Nasal Cannula ? ?Additional Equipment:  ? ?Intra-op Plan:  ? ?Post-operative Plan:  ? ?Informed Consent: I have reviewed the patients History and Physical, chart, labs and discussed the procedure including the risks, benefits and alternatives for the proposed anesthesia with the patient or authorized representative who has indicated his/her understanding and acceptance.  ? ? ? ?Dental Advisory Given ? ?Plan Discussed with: Anesthesiologist, CRNA and Surgeon ? ?Anesthesia Plan Comments:   ? ? ? ? ? ? ?Anesthesia Quick Evaluation ? ?

## 2022-02-01 NOTE — Interval H&P Note (Signed)
History and Physical Interval Note: Preprocedure H&P from 02/01/22 ? was reviewed and there was no interval change after seeing and examining the patient.  Written consent was obtained from the patient after discussion of risks, benefits, and alternatives. Patient has consented to proceed with Colonoscopy with possible intervention ? ? ?02/01/2022 ?7:27 AM ? 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  The various methods of treatment have been discussed with the patient and family. After consideration of risks, benefits and other options for treatment, the patient has consented to  Procedure(s) with comments: ?COLONOSCOPY (N/A) - DM as a surgical intervention.  The patient's history has been reviewed, patient examined, no change in status, stable for surgery.  I have reviewed the patient's chart and labs.  Questions were answered to the patient's satisfaction.   ? ? ?Annamaria Helling ? ? ?

## 2022-02-01 NOTE — Transfer of Care (Signed)
Immediate Anesthesia Transfer of Care Note ? ?Patient: ASHTYN FREILICH ? ?Procedure(s) Performed: COLONOSCOPY ? ?Patient Location: PACU ? ?Anesthesia Type:General ? ?Level of Consciousness: awake and alert  ? ?Airway & Oxygen Therapy: Patient Spontanous Breathing and Patient connected to nasal cannula oxygen ? ?Post-op Assessment: Report given to RN and Post -op Vital signs reviewed and stable ? ?Post vital signs: Reviewed and stable ? ?Last Vitals:  ?Vitals Value Taken Time  ?BP 101/61 02/01/22 0828  ?Temp 36.6 ?C 02/01/22 0825  ?Pulse 76 02/01/22 0828  ?Resp 19 02/01/22 0828  ?SpO2 95 % 02/01/22 0828  ? ? ?Last Pain:  ?Vitals:  ? 02/01/22 0825  ?TempSrc: Temporal  ?PainSc: Asleep  ?   ? ?  ? ?Complications: No notable events documented. ?

## 2022-02-01 NOTE — Anesthesia Postprocedure Evaluation (Signed)
Anesthesia Post Note ? ?Patient: Stephen Mahoney ? ?Procedure(s) Performed: COLONOSCOPY ? ?Patient location during evaluation: Endoscopy ?Anesthesia Type: General ?Level of consciousness: awake and alert ?Pain management: pain level controlled ?Vital Signs Assessment: post-procedure vital signs reviewed and stable ?Respiratory status: spontaneous breathing, nonlabored ventilation, respiratory function stable and patient connected to nasal cannula oxygen ?Cardiovascular status: blood pressure returned to baseline and stable ?Postop Assessment: no apparent nausea or vomiting ?Anesthetic complications: no ? ? ?No notable events documented. ? ? ?Last Vitals:  ?Vitals:  ? 02/01/22 0835 02/01/22 0845  ?BP: (!) 124/99 (!) 148/100  ?Pulse:    ?Resp:    ?Temp:    ?SpO2: 97%   ?  ?Last Pain:  ?Vitals:  ? 02/01/22 0845  ?TempSrc:   ?PainSc: 0-No pain  ? ? ?  ?  ?  ?  ?  ?  ? ?Martha Clan ? ? ? ? ?

## 2022-02-02 LAB — SURGICAL PATHOLOGY

## 2022-10-16 ENCOUNTER — Ambulatory Visit: Payer: Medicare Other | Admitting: Dermatology

## 2022-10-24 ENCOUNTER — Other Ambulatory Visit: Payer: Self-pay | Admitting: Dermatology

## 2022-10-24 ENCOUNTER — Ambulatory Visit (INDEPENDENT_AMBULATORY_CARE_PROVIDER_SITE_OTHER): Payer: Medicare Other | Admitting: Dermatology

## 2022-10-24 VITALS — BP 166/86 | HR 55

## 2022-10-24 DIAGNOSIS — L578 Other skin changes due to chronic exposure to nonionizing radiation: Secondary | ICD-10-CM | POA: Diagnosis not present

## 2022-10-24 DIAGNOSIS — D229 Melanocytic nevi, unspecified: Secondary | ICD-10-CM

## 2022-10-24 DIAGNOSIS — L814 Other melanin hyperpigmentation: Secondary | ICD-10-CM

## 2022-10-24 DIAGNOSIS — L820 Inflamed seborrheic keratosis: Secondary | ICD-10-CM

## 2022-10-24 DIAGNOSIS — D3610 Benign neoplasm of peripheral nerves and autonomic nervous system, unspecified: Secondary | ICD-10-CM | POA: Diagnosis not present

## 2022-10-24 DIAGNOSIS — L719 Rosacea, unspecified: Secondary | ICD-10-CM | POA: Diagnosis not present

## 2022-10-24 DIAGNOSIS — L853 Xerosis cutis: Secondary | ICD-10-CM

## 2022-10-24 DIAGNOSIS — Z85828 Personal history of other malignant neoplasm of skin: Secondary | ICD-10-CM

## 2022-10-24 DIAGNOSIS — Z1283 Encounter for screening for malignant neoplasm of skin: Secondary | ICD-10-CM

## 2022-10-24 DIAGNOSIS — Z79899 Other long term (current) drug therapy: Secondary | ICD-10-CM

## 2022-10-24 DIAGNOSIS — L821 Other seborrheic keratosis: Secondary | ICD-10-CM

## 2022-10-24 MED ORDER — DOXYCYCLINE HYCLATE 20 MG PO TABS: ORAL_TABLET | ORAL | 11 refills | Status: DC

## 2022-10-24 NOTE — Patient Instructions (Addendum)
Gentle Skin Care Guide  1. Bathe no more than once a day.  2. Avoid bathing in hot water  3. Use a mild soap like Dove, Vanicream, Cetaphil, CeraVe. Can use Lever 2000 or Cetaphil antibacterial soap  4. Use soap only where you need it. On most days, use it under your arms, between your legs, and on your feet. Let the water rinse other areas unless visibly dirty.  5. When you get out of the bath/shower, use a towel to gently blot your skin dry, don't rub it.  6. While your skin is still a little damp, apply a moisturizing cream such as Vanicream, CeraVe, Cetaphil, Eucerin, Sarna lotion or plain Vaseline Jelly. For hands apply Neutrogena Norwegian Hand Cream or Excipial Hand Cream.  7. Reapply moisturizer any time you start to itch or feel dry.  8. Sometimes using free and clear laundry detergents can be helpful. Fabric softener sheets should be avoided. Downy Free & Gentle liquid, or any liquid fabric softener that is free of dyes and perfumes, it acceptable to use  9. If your doctor has given you prescription creams you may apply moisturizers over them      Due to recent changes in healthcare laws, you may see results of your pathology and/or laboratory studies on MyChart before the doctors have had a chance to review them. We understand that in some cases there may be results that are confusing or concerning to you. Please understand that not all results are received at the same time and often the doctors may need to interpret multiple results in order to provide you with the best plan of care or course of treatment. Therefore, we ask that you please give us 2 business days to thoroughly review all your results before contacting the office for clarification. Should we see a critical lab result, you will be contacted sooner.   If You Need Anything After Your Visit  If you have any questions or concerns for your doctor, please call our main line at [phone] and press option 4 to reach  your doctor's medical assistant. If no one answers, please leave a voicemail as directed and we will return your call as soon as possible. Messages left after 4 pm will be answered the following business day.   You may also send us a message via MyChart. We typically respond to MyChart messages within 1-2 business days.  For prescription refills, please ask your pharmacy to contact our office. Our fax number is [phone].  If you have an urgent issue when the clinic is closed that cannot wait until the next business day, you can page your doctor at the number below.    Please note that while we do our best to be available for urgent issues outside of office hours, we are not available 24/7.   If you have an urgent issue and are unable to reach us, you may choose to seek medical care at your doctor's office, retail clinic, urgent care center, or emergency room.  If you have a medical emergency, please immediately call 911 or go to the emergency department.  Pager Numbers  - Dr. Kowalski: [phone]  - Dr. Moye: [phone]  - Dr. Stewart: [phone]  In the event of inclement weather, please call our main line at [phone] for an update on the status of any delays or closures.  Dermatology Medication Tips: Please keep the boxes that topical medications come in in order to help keep track of the instructions about   where and how to use these. Pharmacies typically print the medication instructions only on the boxes and not directly on the medication tubes.   If your medication is too expensive, please contact our office at [phone] option 4 or send us a message through MyChart.   We are unable to tell what your co-pay for medications will be in advance as this is different depending on your insurance coverage. However, we may be able to find a substitute medication at lower cost or fill out paperwork to get insurance to cover a needed medication.   If a prior authorization  is required to get your medication covered by your insurance company, please allow us 1-2 business days to complete this process.  Drug prices often vary depending on where the prescription is filled and some pharmacies may offer cheaper prices.  The website www.goodrx.com contains coupons for medications through different pharmacies. The prices here do not account for what the cost may be with help from insurance (it may be cheaper with your insurance), but the website can give you the price if you did not use any insurance.  - You can print the associated coupon and take it with your prescription to the pharmacy.  - You may also stop by our office during regular business hours and pick up a GoodRx coupon card.  - If you need your prescription sent electronically to a different pharmacy, notify our office through [facility] MyChart or by phone at [phone] option 4.     Si Usted Necesita Algo Despus de Su Visita  Tambin puede enviarnos un mensaje a travs de MyChart. Por lo general respondemos a los mensajes de MyChart en el transcurso de 1 a 2 das hbiles.  Para renovar recetas, por favor pida a su farmacia que se ponga en contacto con nuestra oficina. Nuestro nmero de fax es el [phone].  Si tiene un asunto urgente cuando la clnica est cerrada y que no puede esperar hasta el siguiente da hbil, puede llamar/localizar a su doctor(a) al nmero que aparece a continuacin.   Por favor, tenga en cuenta que aunque hacemos todo lo posible para estar disponibles para asuntos urgentes fuera del horario de oficina, no estamos disponibles las 24 horas del da, los 7 das de la semana.   Si tiene un problema urgente y no puede comunicarse con nosotros, puede optar por buscar atencin mdica  en el consultorio de su doctor(a), en una clnica privada, en un centro de atencin urgente o en una sala de emergencias.  Si tiene una emergencia mdica, por favor llame inmediatamente al 911 o  vaya a la sala de emergencias.  Nmeros de bper  - Dr. Kowalski: [phone]  - Dra. Moye: [phone]  - Dra. Stewart: [phone]  En caso de inclemencias del tiempo, por favor llame a nuestra lnea principal al [phone] para una actualizacin sobre el estado de cualquier retraso o cierre.  Consejos para la medicacin en dermatologa: Por favor, guarde las cajas en las que vienen los medicamentos de uso tpico para ayudarle a seguir las instrucciones sobre dnde y cmo usarlos. Las farmacias generalmente imprimen las instrucciones del medicamento slo en las cajas y no directamente en los tubos del medicamento.   Si su medicamento es muy caro, por favor, pngase en contacto con nuestra oficina llamando al [phone] y presione la opcin 4 o envenos un mensaje a travs de MyChart.   No podemos decirle cul ser su copago por los medicamentos por adelantado ya que esto   es diferente dependiendo de la cobertura de su seguro. Sin embargo, es posible que podamos encontrar un medicamento sustituto a menor costo o llenar un formulario para que el seguro cubra el medicamento que se considera necesario.   Si se requiere una autorizacin previa para que su compaa de seguros cubra su medicamento, por favor permtanos de 1 a 2 das hbiles para completar este proceso.  Los precios de los medicamentos varan con frecuencia dependiendo del lugar de dnde se surte la receta y alguna farmacias pueden ofrecer precios ms baratos.  El sitio web www.goodrx.com tiene cupones para medicamentos de diferentes farmacias. Los precios aqu no tienen en cuenta lo que podra costar con la ayuda del seguro (puede ser ms barato con su seguro), pero el sitio web puede darle el precio si no utiliz ningn seguro.  - Puede imprimir el cupn correspondiente y llevarlo con su receta a la farmacia.  - Tambin puede pasar por nuestra oficina durante el horario de atencin regular y recoger una tarjeta de cupones  de GoodRx.  - Si necesita que su receta se enve electrnicamente a una farmacia diferente, informe a nuestra oficina a travs de MyChart de [place] o por telfono llamando al [phone] y presione la opcin 4.  

## 2022-10-24 NOTE — Progress Notes (Signed)
Follow-Up Visit   Subjective  Stephen Mahoney is a 76 y.o. male who presents for the following: Annual Exam (Hx BCC, AKs). The patient presents for Total-Body Skin Exam (TBSE) for skin cancer screening and mole check.  The patient has spots, moles and lesions to be evaluated, some may be new or changing.  The following portions of the chart were reviewed this encounter and updated as appropriate:   Tobacco  Allergies  Meds  Problems  Med Hx  Surg Hx  Fam Hx     Review of Systems:  No other skin or systemic complaints except as noted in HPI or Assessment and Plan.  Objective  Well appearing patient in no apparent distress; mood and affect are within normal limits.  A full examination was performed including scalp, head, eyes, ears, nose, lips, neck, chest, axillae, abdomen, back, buttocks, bilateral upper extremities, bilateral lower extremities, hands, feet, fingers, toes, fingernails, and toenails. All findings within normal limits unless otherwise noted below.  Face Mid face erythema with telangiectasias +/- scattered inflammatory papules.   L top of shoulder Flesh colored papule.   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 It is treatable but not curable. It sometimes affects the eyes (ocular rosacea) as well. It may respond to topical and/or systemic medication and can flare with stress, sun exposure, alcohol, exercise, topical steroids (including hydrocortisone/cortisone 10) and some foods.  Daily application of broad spectrum spf 30+ sunscreen to face is recommended to reduce flares.  Continue doxycycline '20mg'$  po BID. Doxycycline should be taken with food to prevent nausea. Do not lay down for 30 minutes after taking. Be cautious with sun exposure and use good sun protection while on this  medication. Pregnant women should not take this medication.   Discussed the treatment option of BBL/laser.  Typically we recommend 1-3 treatment sessions about 5-8 weeks apart for best results.  The patient's condition may require "maintenance treatments" in the future.  The fee for BBL / laser treatments is $350 per treatment session for the whole face.  A fee can be quoted for other parts of the body. Insurance typically does not pay for BBL/laser treatments and therefore the fee is an out-of-pocket cost.  Related Medications doxycycline (PERIOSTAT) 20 MG tablet Take one tab po BID. Take with food and drink  Neurofibroma L top of shoulder Benign-appearing.  Observation.  Call clinic for new or changing lesions.  Recommend daily use of broad spectrum spf 30+ sunscreen to sun-exposed areas.    Inflamed seborrheic keratosis (16) R neck/shoulder x 16 Symptomatic, irritating, patient would like treated. Destruction of lesion - R neck/shoulder x 16 Complexity: simple   Destruction method: cryotherapy   Informed consent: discussed and consent obtained   Timeout:  patient name, date of birth, surgical site, and procedure verified Lesion destroyed using liquid nitrogen: Yes   Region frozen until ice ball extended beyond lesion: Yes   Outcome: patient tolerated procedure well with no complications   Post-procedure details: wound care instructions given    Lentigines - Scattered tan macules - Due to sun exposure - Benign-appearing, observe - Recommend daily broad spectrum sunscreen SPF 30+ to sun-exposed areas, reapply every 2 hours as needed. - Call for any changes  Seborrheic Keratoses - Stuck-on, waxy, tan-brown papules and/or plaques  - Benign-appearing - Discussed  benign etiology and prognosis. - Observe - Call for any changes  Melanocytic Nevi - Tan-brown and/or pink-flesh-colored symmetric macules and papules - Benign appearing on exam today - Observation - Call clinic for  new or changing moles - Recommend daily use of broad spectrum spf 30+ sunscreen to sun-exposed areas.   Hemangiomas - Red papules - Discussed benign nature - Observe - Call for any changes  Actinic Damage - Chronic condition, secondary to cumulative UV/sun exposure - diffuse scaly erythematous macules with underlying dyspigmentation - Recommend daily broad spectrum sunscreen SPF 30+ to sun-exposed areas, reapply every 2 hours as needed.  - Staying in the shade or wearing long sleeves, sun glasses (UVA+UVB protection) and wide brim hats (4-inch brim around the entire circumference of the hat) are also recommended for sun protection.  - Call for new or changing lesions.  History of Basal Cell Carcinoma of the Skin - No evidence of recurrence today - Recommend regular full body skin exams - Recommend daily broad spectrum sunscreen SPF 30+ to sun-exposed areas, reapply every 2 hours as needed.  - Call if any new or changing lesions are noted between office visits  Xerosis - diffuse xerotic patches - recommend gentle, hydrating skin care - gentle skin care handout given  Skin cancer screening performed today.  Return in about 1 year (around 10/25/2023) for TBSE.  Luther Redo, CMA, am acting as scribe for Sarina Ser, MD . Documentation: I have reviewed the above documentation for accuracy and completeness, and I agree with the above.  Sarina Ser, MD

## 2022-11-04 ENCOUNTER — Encounter: Payer: Self-pay | Admitting: Dermatology

## 2023-10-23 ENCOUNTER — Other Ambulatory Visit: Payer: Self-pay | Admitting: Dermatology

## 2023-10-23 DIAGNOSIS — L719 Rosacea, unspecified: Secondary | ICD-10-CM

## 2023-10-30 ENCOUNTER — Ambulatory Visit (INDEPENDENT_AMBULATORY_CARE_PROVIDER_SITE_OTHER): Payer: Medicare Other | Admitting: Dermatology

## 2023-10-30 DIAGNOSIS — L820 Inflamed seborrheic keratosis: Secondary | ICD-10-CM | POA: Diagnosis not present

## 2023-10-30 DIAGNOSIS — Z85828 Personal history of other malignant neoplasm of skin: Secondary | ICD-10-CM

## 2023-10-30 DIAGNOSIS — L719 Rosacea, unspecified: Secondary | ICD-10-CM | POA: Diagnosis not present

## 2023-10-30 DIAGNOSIS — L711 Rhinophyma: Secondary | ICD-10-CM

## 2023-10-30 DIAGNOSIS — Z7189 Other specified counseling: Secondary | ICD-10-CM

## 2023-10-30 DIAGNOSIS — L219 Seborrheic dermatitis, unspecified: Secondary | ICD-10-CM

## 2023-10-30 DIAGNOSIS — D1801 Hemangioma of skin and subcutaneous tissue: Secondary | ICD-10-CM

## 2023-10-30 DIAGNOSIS — L814 Other melanin hyperpigmentation: Secondary | ICD-10-CM

## 2023-10-30 DIAGNOSIS — L578 Other skin changes due to chronic exposure to nonionizing radiation: Secondary | ICD-10-CM

## 2023-10-30 DIAGNOSIS — Z1283 Encounter for screening for malignant neoplasm of skin: Secondary | ICD-10-CM

## 2023-10-30 DIAGNOSIS — B353 Tinea pedis: Secondary | ICD-10-CM

## 2023-10-30 DIAGNOSIS — Z79899 Other long term (current) drug therapy: Secondary | ICD-10-CM

## 2023-10-30 DIAGNOSIS — L821 Other seborrheic keratosis: Secondary | ICD-10-CM

## 2023-10-30 DIAGNOSIS — W908XXA Exposure to other nonionizing radiation, initial encounter: Secondary | ICD-10-CM

## 2023-10-30 MED ORDER — KETOCONAZOLE 2 % EX CREA: TOPICAL_CREAM | CUTANEOUS | 11 refills | Status: AC

## 2023-10-30 MED ORDER — DOXYCYCLINE HYCLATE 20 MG PO TABS: ORAL_TABLET | ORAL | 11 refills | Status: DC

## 2023-10-30 NOTE — Progress Notes (Signed)
Follow-Up Visit   Subjective  Stephen Mahoney is a 77 y.o. male who presents for the following: Skin Cancer Screening and Full Body Skin Exam  The patient presents for Total-Body Skin Exam (TBSE) for skin cancer screening and mole check. The patient has spots, moles and lesions to be evaluated, some may be new or changing and the patient may have concern these could be cancer.    The following portions of the chart were reviewed this encounter and updated as appropriate: medications, allergies, medical history  Review of Systems:  No other skin or systemic complaints except as noted in HPI or Assessment and Plan.  Objective  Well appearing patient in no apparent distress; mood and affect are within normal limits.  A full examination was performed including scalp, head, eyes, ears, nose, lips, neck, chest, axillae, abdomen, back, buttocks, bilateral upper extremities, bilateral lower extremities, hands, feet, fingers, toes, fingernails, and toenails. All findings within normal limits unless otherwise noted below.   Relevant physical exam findings are noted in the Assessment and Plan.  L side x 1 (16) Erythematous stuck-on, waxy papule or plaque    Assessment & Plan   SKIN CANCER SCREENING PERFORMED TODAY.  ACTINIC DAMAGE - Chronic condition, secondary to cumulative UV/sun exposure - diffuse scaly erythematous macules with underlying dyspigmentation - Recommend daily broad spectrum sunscreen SPF 30+ to sun-exposed areas, reapply every 2 hours as needed.  - Staying in the shade or wearing long sleeves, sun glasses (UVA+UVB protection) and wide brim hats (4-inch brim around the entire circumference of the hat) are also recommended for sun protection.  - Call for new or changing lesions.  LENTIGINES, SEBORRHEIC KERATOSES, HEMANGIOMAS - Benign normal skin lesions - Benign-appearing - Call for any changes  MELANOCYTIC NEVI - Tan-brown and/or pink-flesh-colored symmetric macules  and papules - Benign appearing on exam today - Observation - Call clinic for new or changing moles - Recommend daily use of broad spectrum spf 30+ sunscreen to sun-exposed areas.   HISTORY OF BASAL CELL CARCINOMA OF THE SKIN - No evidence of recurrence today - Recommend regular full body skin exams - Recommend daily broad spectrum sunscreen SPF 30+ to sun-exposed areas, reapply every 2 hours as needed.  - Call if any new or changing lesions are noted between office visits  Inflamed seborrheic keratosis (16) L side x 1  Symptomatic, irritating, patient would like treated.   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 Condition is symptomatic / bothersome to patient. Not to goal.  Rosacea is a chronic progressive skin condition usually affecting the face of adults, causing redness and/or acne bumps. It is treatable but not curable. It sometimes affects the eyes (ocular rosacea) as well. It may respond to topical and/or systemic medication and can flare with stress, sun exposure, alcohol, exercise, topical steroids (including hydrocortisone/cortisone 10) and some foods.  Daily application of broad spectrum spf 30+ sunscreen to face is recommended to reduce flares.   Continue doxycycline 20mg  po  BID. Doxycycline should be taken with food to prevent nausea. Do not lay down for 30 minutes after taking. Be cautious with sun exposure and use good sun protection while on  this medication. Pregnant women should not take this medication.    Discussed the treatment option of BBL/laser.  Typically we recommend 1-3 treatment sessions about 5-8 weeks apart for best results.  The patient's condition may require "maintenance treatments" in the future.  The fee for BBL / laser treatments is $350 per treatment session for the whole face.  A fee can be quoted for other parts of the body. Insurance typically does not pay for BBL/laser treatments and therefore the fee is an out-of-pocket cost.  TINEA PEDIS Exam: Scaling and maceration web spaces and over distal and lateral soles. Chronic and persistent condition with duration or expected duration over one year. Condition is symptomatic / bothersome to patient. Not to goal.  Treatment Plan: Start Ketoconazole 2% cream QHS.    Return in about 1 year (around 10/29/2024) for TBSE.  Maylene Roes, CMA, am acting as scribe for Armida Sans, MD .   Documentation: I have reviewed the above documentation for accuracy and completeness, and I agree with the above.  Armida Sans, MD

## 2023-10-30 NOTE — Patient Instructions (Signed)

## 2023-11-05 ENCOUNTER — Encounter: Payer: Self-pay | Admitting: Dermatology

## 2024-03-09 ENCOUNTER — Other Ambulatory Visit: Payer: Self-pay

## 2024-03-09 ENCOUNTER — Emergency Department

## 2024-03-09 ENCOUNTER — Inpatient Hospital Stay
Admission: EM | Admit: 2024-03-09 | Discharge: 2024-03-12 | DRG: 660 | Disposition: A | Discharge status: Home / Self Care | Attending: Internal Medicine | Admitting: Internal Medicine

## 2024-03-09 DIAGNOSIS — N12 Tubulo-interstitial nephritis, not specified as acute or chronic: Secondary | ICD-10-CM | POA: Diagnosis not present

## 2024-03-09 DIAGNOSIS — N200 Calculus of kidney: Secondary | ICD-10-CM | POA: Diagnosis present

## 2024-03-09 DIAGNOSIS — N179 Acute kidney failure, unspecified: Secondary | ICD-10-CM | POA: Insufficient documentation

## 2024-03-09 DIAGNOSIS — N1832 Chronic kidney disease, stage 3b: Secondary | ICD-10-CM | POA: Diagnosis present

## 2024-03-09 DIAGNOSIS — E1169 Type 2 diabetes mellitus with other specified complication: Secondary | ICD-10-CM | POA: Diagnosis not present

## 2024-03-09 DIAGNOSIS — I129 Hypertensive chronic kidney disease with stage 1 through stage 4 chronic kidney disease, or unspecified chronic kidney disease: Secondary | ICD-10-CM | POA: Diagnosis present

## 2024-03-09 DIAGNOSIS — E1122 Type 2 diabetes mellitus with diabetic chronic kidney disease: Secondary | ICD-10-CM | POA: Diagnosis present

## 2024-03-09 DIAGNOSIS — E119 Type 2 diabetes mellitus without complications: Secondary | ICD-10-CM

## 2024-03-09 DIAGNOSIS — K5900 Constipation, unspecified: Secondary | ICD-10-CM | POA: Diagnosis not present

## 2024-03-09 DIAGNOSIS — N136 Pyonephrosis: Principal | ICD-10-CM | POA: Diagnosis present

## 2024-03-09 DIAGNOSIS — G4733 Obstructive sleep apnea (adult) (pediatric): Secondary | ICD-10-CM | POA: Diagnosis present

## 2024-03-09 DIAGNOSIS — R7881 Bacteremia: Secondary | ICD-10-CM | POA: Diagnosis present

## 2024-03-09 DIAGNOSIS — Z7982 Long term (current) use of aspirin: Secondary | ICD-10-CM

## 2024-03-09 DIAGNOSIS — Z7984 Long term (current) use of oral hypoglycemic drugs: Secondary | ICD-10-CM

## 2024-03-09 DIAGNOSIS — Z01810 Encounter for preprocedural cardiovascular examination: Secondary | ICD-10-CM | POA: Diagnosis not present

## 2024-03-09 DIAGNOSIS — N189 Chronic kidney disease, unspecified: Secondary | ICD-10-CM | POA: Diagnosis not present

## 2024-03-09 DIAGNOSIS — Z794 Long term (current) use of insulin: Secondary | ICD-10-CM | POA: Diagnosis not present

## 2024-03-09 DIAGNOSIS — Z79899 Other long term (current) drug therapy: Secondary | ICD-10-CM | POA: Diagnosis not present

## 2024-03-09 DIAGNOSIS — Z85828 Personal history of other malignant neoplasm of skin: Secondary | ICD-10-CM | POA: Diagnosis not present

## 2024-03-09 DIAGNOSIS — N201 Calculus of ureter: Secondary | ICD-10-CM | POA: Diagnosis not present

## 2024-03-09 DIAGNOSIS — N3000 Acute cystitis without hematuria: Secondary | ICD-10-CM | POA: Diagnosis not present

## 2024-03-09 DIAGNOSIS — N390 Urinary tract infection, site not specified: Secondary | ICD-10-CM

## 2024-03-09 DIAGNOSIS — N178 Other acute kidney failure: Secondary | ICD-10-CM | POA: Diagnosis not present

## 2024-03-09 DIAGNOSIS — B964 Proteus (mirabilis) (morganii) as the cause of diseases classified elsewhere: Secondary | ICD-10-CM | POA: Diagnosis present

## 2024-03-09 DIAGNOSIS — E785 Hyperlipidemia, unspecified: Secondary | ICD-10-CM | POA: Diagnosis present

## 2024-03-09 DIAGNOSIS — E872 Acidosis, unspecified: Secondary | ICD-10-CM | POA: Diagnosis not present

## 2024-03-09 DIAGNOSIS — Z882 Allergy status to sulfonamides status: Secondary | ICD-10-CM

## 2024-03-09 DIAGNOSIS — N139 Obstructive and reflux uropathy, unspecified: Secondary | ICD-10-CM | POA: Diagnosis not present

## 2024-03-09 DIAGNOSIS — Z87891 Personal history of nicotine dependence: Secondary | ICD-10-CM | POA: Diagnosis not present

## 2024-03-09 DIAGNOSIS — E66812 Obesity, class 2: Secondary | ICD-10-CM | POA: Diagnosis present

## 2024-03-09 DIAGNOSIS — I10 Essential (primary) hypertension: Secondary | ICD-10-CM | POA: Diagnosis not present

## 2024-03-09 DIAGNOSIS — N400 Enlarged prostate without lower urinary tract symptoms: Secondary | ICD-10-CM | POA: Diagnosis present

## 2024-03-09 DIAGNOSIS — Z6838 Body mass index (BMI) 38.0-38.9, adult: Secondary | ICD-10-CM

## 2024-03-09 DIAGNOSIS — N10 Acute tubulo-interstitial nephritis: Secondary | ICD-10-CM | POA: Diagnosis not present

## 2024-03-09 DIAGNOSIS — E860 Dehydration: Secondary | ICD-10-CM | POA: Diagnosis present

## 2024-03-09 LAB — BASIC METABOLIC PANEL WITH GFR
Anion gap: 11 (ref 5–15)
BUN: 56 mg/dL — ABNORMAL HIGH (ref 8–23)
CO2: 21 mmol/L — ABNORMAL LOW (ref 22–32)
Calcium: 9.8 mg/dL (ref 8.9–10.3)
Chloride: 107 mmol/L (ref 98–111)
Creatinine, Ser: 2.59 mg/dL — ABNORMAL HIGH (ref 0.61–1.24)
GFR, Estimated: 25 mL/min — ABNORMAL LOW (ref >60)
Glucose, Bld: 205 mg/dL — ABNORMAL HIGH (ref 70–99)
Potassium: 3.6 mmol/L (ref 3.5–5.1)
Sodium: 139 mmol/L (ref 135–145)

## 2024-03-09 LAB — URINALYSIS, ROUTINE W REFLEX MICROSCOPIC
Bilirubin Urine: NEGATIVE
Glucose, UA: >=500 mg/dL — AB
Ketones, ur: NEGATIVE mg/dL
Nitrite: NEGATIVE
Protein, ur: 100 mg/dL — AB
Specific Gravity, Urine: 1.016 (ref 1.005–1.030)
WBC, UA: >50 WBC/hpf (ref 0–5)
pH: 5 (ref 5.0–8.0)

## 2024-03-09 LAB — CBC
HCT: 48.5 % (ref 39.0–52.0)
Hemoglobin: 16.4 g/dL (ref 13.0–17.0)
MCH: 31.1 pg (ref 26.0–34.0)
MCHC: 33.8 g/dL (ref 30.0–36.0)
MCV: 91.9 fL (ref 80.0–100.0)
Platelets: 279 10*3/uL (ref 150–400)
RBC: 5.28 MIL/uL (ref 4.22–5.81)
RDW: 13 % (ref 11.5–15.5)
WBC: 18.8 10*3/uL — ABNORMAL HIGH (ref 4.0–10.5)
nRBC: 0 % (ref 0.0–0.2)

## 2024-03-09 LAB — LACTIC ACID, PLASMA
Lactic Acid, Venous: 1.5 mmol/L (ref 0.5–1.9)
Lactic Acid, Venous: 1.7 mmol/L (ref 0.5–1.9)

## 2024-03-09 LAB — GLUCOSE, CAPILLARY: Glucose-Capillary: 223 mg/dL — ABNORMAL HIGH (ref 70–99)

## 2024-03-09 LAB — HEMOGLOBIN A1C
Hgb A1c MFr Bld: 8.1 % — ABNORMAL HIGH (ref 4.8–5.6)
Mean Plasma Glucose: 185.77 mg/dL

## 2024-03-09 MED ORDER — INSULIN GLARGINE-YFGN 100 UNIT/ML ~~LOC~~ SOLN
8.0000 [IU] | Freq: Every day | SUBCUTANEOUS | Status: DC
  Administered 2024-03-09 – 2024-03-11 (×3): 8 [IU] via SUBCUTANEOUS
  Filled 2024-03-09 (×3): qty 0.08

## 2024-03-09 MED ORDER — MORPHINE SULFATE (PF) 4 MG/ML IV SOLN
4.0000 mg | Freq: Once | INTRAVENOUS | Status: AC
  Administered 2024-03-09: 4 mg via INTRAVENOUS
  Filled 2024-03-09: qty 1

## 2024-03-09 MED ORDER — LACTATED RINGERS IV SOLN: INTRAVENOUS | Status: AC

## 2024-03-09 MED ORDER — ACETAMINOPHEN 325 MG PO TABS
650.0000 mg | ORAL_TABLET | Freq: Four times a day (QID) | ORAL | Status: DC | PRN
  Administered 2024-03-10 – 2024-03-12 (×3): 650 mg via ORAL
  Filled 2024-03-09 (×3): qty 2

## 2024-03-09 MED ORDER — HYDROMORPHONE HCL 1 MG/ML IJ SOLN
1.0000 mg | INTRAMUSCULAR | Status: DC | PRN
  Administered 2024-03-09 – 2024-03-12 (×10): 1 mg via INTRAVENOUS
  Filled 2024-03-09 (×10): qty 1

## 2024-03-09 MED ORDER — INSULIN ASPART 100 UNIT/ML IJ SOLN
0.0000 [IU] | Freq: Three times a day (TID) | INTRAMUSCULAR | Status: DC
  Administered 2024-03-10: 2 [IU] via SUBCUTANEOUS
  Administered 2024-03-11: 1 [IU] via SUBCUTANEOUS
  Filled 2024-03-09 (×2): qty 1

## 2024-03-09 MED ORDER — SODIUM CHLORIDE 0.9 % IV SOLN
2.0000 g | Freq: Once | INTRAVENOUS | Status: AC
  Administered 2024-03-09: 2 g via INTRAVENOUS
  Filled 2024-03-09: qty 20

## 2024-03-09 MED ORDER — SODIUM CHLORIDE 0.9 % IV SOLN
2.0000 g | INTRAVENOUS | Status: DC
  Filled 2024-03-09: qty 20

## 2024-03-09 MED ORDER — SODIUM CHLORIDE 0.9 % IV SOLN: INTRAVENOUS | Status: DC

## 2024-03-09 MED ORDER — ONDANSETRON HCL 4 MG/2ML IJ SOLN
4.0000 mg | Freq: Four times a day (QID) | INTRAMUSCULAR | Status: DC | PRN
  Administered 2024-03-10 – 2024-03-12 (×6): 4 mg via INTRAVENOUS
  Filled 2024-03-09 (×5): qty 2

## 2024-03-09 MED ORDER — ONDANSETRON HCL 4 MG/2ML IJ SOLN
4.0000 mg | Freq: Once | INTRAMUSCULAR | Status: AC
  Administered 2024-03-09: 4 mg via INTRAVENOUS
  Filled 2024-03-09: qty 2

## 2024-03-09 MED ORDER — ENOXAPARIN SODIUM 80 MG/0.8ML IJ SOSY
0.5000 mg/kg | PREFILLED_SYRINGE | INTRAMUSCULAR | Status: DC
  Administered 2024-03-10 – 2024-03-11 (×2): 67.5 mg via SUBCUTANEOUS
  Filled 2024-03-09 (×2): qty 0.68

## 2024-03-09 MED ORDER — SODIUM CHLORIDE 0.9 % IV BOLUS
1000.0000 mL | Freq: Once | INTRAVENOUS | Status: AC
  Administered 2024-03-09: 1000 mL via INTRAVENOUS

## 2024-03-09 MED ORDER — ONDANSETRON HCL 4 MG PO TABS: 4.0000 mg | ORAL_TABLET | Freq: Four times a day (QID) | ORAL | Status: DC | PRN

## 2024-03-09 NOTE — H&P (Addendum)
 History and Physical    Patient: Stephen Mahoney NWG:[id number] DOB: [birth year] DOA: 03/09/2024 DOS: the patient was seen and examined on 03/09/2024 PCP: Marisue Ivan, MD  Patient coming from: Home  Chief Complaint:  Chief Complaint  Patient presents with   Flank Pain   HPI: Stephen Mahoney is a 78 y.o. male with medical history significant of obesity, type 2 diabetes, CKD, hypertension, history of kidney stones presenting with obstructive uropathy, lithiasis, acute on chronic kidney disease.  Patient reports worsening left-sided flank pain over multiple days.  Mild fevers and chills.  Noted prior history of kidney stones in the past.  Denies any prior intervention for passing of stones.  No chest pain or shortness of breath.  Mild malaise.  Mild nausea and vomiting.  No diarrhea.  No focal hemiparesis or confusion.  No reported hematuria. Presented to the ER afebrile, hemodynamically stable.  Satting well on room air.  White count 18.8, hemoglobin 6.4, platelets 279, urinalysis indicative of infection, creatinine 2.6.  Glucose 205. Review of Systems: As mentioned in the history of present illness. All other systems reviewed and are negative. Past Medical History:  Diagnosis Date   Actinic keratosis    Basal cell carcinoma 05/07/2013   Right mid to distal dorsum nose.    Basal cell carcinoma 01/15/2018   Left medial pectoral. Nodular, ulcerated.   Basal cell carcinoma 04/21/2020   Left mid lat. pretibial. Nodular. EDC.   BPH (benign prostatic hyperplasia)    Chronic kidney disease    Chronic kidney disease    Diabetes mellitus without complication (HCC)    High blood triglycerides    Hyperlipidemia with low HDL    Hypertension    Obesity    Past Surgical History:  Procedure Laterality Date   COLON SURGERY     COLONOSCOPY N/A 02/01/2022   Procedure: COLONOSCOPY;  Surgeon: Jaynie Collins, DO;  Location[facility] ENDOSCOPY;  Service: Gastroenterology;  Laterality: N/A;  DM    COLONOSCOPY W/ POLYPECTOMY     COLONOSCOPY WITH PROPOFOL N/A 04/12/2016   Procedure: COLONOSCOPY WITH PROPOFOL;  Surgeon: Scot Jun, MD;  Location: [facility] ENDOSCOPY;  Service: Endoscopy;  Laterality: N/A;   MM BCCCP - DIAG MAMMO W/CAD ([facility] HX) N/A    left lip nose   TONSILLECTOMY     Social History:  reports that he quit smoking about 9 years ago. He does not have any smokeless tobacco history on file. He reports that he does not drink alcohol and does not use drugs.  Allergies  Allergen Reactions   Sulfa Antibiotics Swelling and Rash    History reviewed. No pertinent family history.  Prior to Admission medications   Medication Sig Start Date End Date Taking? Authorizing Provider  amLODipine (NORVASC) 5 MG tablet Take 10 mg by mouth daily.   Yes [provider]  doxycycline (PERIOSTAT) 20 MG tablet TAKE ONE TABLET BY MOUTH TWICE DAILY WITH FOOD 10/30/23  Yes Deirdre Evener, MD  dutasteride (AVODART) 0.5 MG capsule Take 0.5 mg by mouth daily.   Yes [provider]  FARXIGA 10 MG TABS tablet Take 10 mg by mouth daily.   Yes [provider]  glimepiride (AMARYL) 4 MG tablet Take 4 mg by mouth daily with breakfast.   Yes [provider]  insulin lispro (HUMALOG) 100 UNIT/ML injection 5 Units 3 (three) times daily with meals. 02/06/24  Yes [provider]  ketoconazole (NIZORAL) 2 % cream Apply to feet QHS. Patient taking differently:  Apply 1 Application topically daily as needed for irritation. Apply to feet QHS. 10/30/23  Yes Deirdre Evener, MD  LANTUS SOLOSTAR 100 UNIT/ML Solostar Pen Inject 15 Units into the skin at bedtime. 02/06/24  Yes [provider]  lovastatin (MEVACOR) 20 MG tablet Take 20 mg by mouth at bedtime.   Yes [provider]  tamsulosin (FLOMAX) 0.4 MG CAPS capsule Take 0.4 mg by mouth.   Yes [provider]  traZODone (DESYREL) 100 MG tablet Take 100 mg by mouth at bedtime.   Yes [provider]  valsartan-hydrochlorothiazide (DIOVAN-HCT) 320-25 MG tablet Take 1 tablet by mouth daily.   Yes [provider]  aspirin 81 MG tablet Take 81 mg by mouth daily. Patient not taking: Reported on 02/01/2022    [provider]  Dulaglutide (TRULICITY) 3 MG/0.5ML SOPN Inject 3 mg into the skin. Patient not taking: Reported on 03/09/2024    [provider]  Dutasteride-Tamsulosin HCl 0.5-0.4 MG CAPS Take by mouth. Patient not taking: Reported on 03/09/2024    [provider]  glucose blood test strip 1 each by Other route as needed for other. Use as instructed    [provider]  Liraglutide (VICTOZA [place]) Inject into the skin. Patient not taking: Reported on 02/01/2022    [provider]  metFORMIN (GLUCOPHAGE) 500 MG tablet Take by mouth 2 (two) times daily with a meal. Patient not taking: Reported on 03/09/2024    [provider]    Physical Exam: Vitals:   03/09/24 1028 03/09/24 1030 03/09/24 1605  BP:  (!) 124/95   Pulse:  62   Resp: 18    Temp: 98 F (36.7 C)  98.6 F (37 C)  TempSrc: Oral  Oral  SpO2:  95%   Weight: 136.1 kg    Height: 6\' 2"  (1.88 m)     Physical Exam Constitutional:      Appearance: He is obese.  HENT:     Head: Normocephalic and atraumatic.     Nose: Nose normal.     Mouth/Throat:     Mouth: Mucous membranes are moist.  Eyes:     Pupils: Pupils are equal, round, and reactive to light.  Cardiovascular:     Rate and Rhythm: Normal rate and regular rhythm.  Pulmonary:     Effort: Pulmonary effort is normal.  Abdominal:     General: Bowel sounds are normal.     Comments: + L sided flank pain    Musculoskeletal:        General: Normal range of motion.     Cervical back: Normal range of motion.  Skin:    General: Skin is warm.  Neurological:     General: No focal deficit present.  Psychiatric:        Mood and Affect: Mood normal.     Data Reviewed:  There are no new results  to review at this time.  CT ABDOMEN PELVIS WO CONTRAST CLINICAL DATA:  History of lithotripsy with acute onset left flank pain  EXAM: CT ABDOMEN AND PELVIS WITHOUT CONTRAST  TECHNIQUE: Multidetector CT imaging of the abdomen and pelvis was performed following the standard protocol without IV contrast.  RADIATION DOSE REDUCTION: This exam was performed according to the departmental dose-optimization program which includes automated exposure control, adjustment of the mA and/or kV according to patient size and/or use of iterative reconstruction technique.  COMPARISON:  CT abdomen and pelvis dated 12/03/2006  FINDINGS: Lower chest: No focal consolidation  or pulmonary nodule in the lung bases. No pleural effusion or pneumothorax demonstrated. Partially imaged heart size is normal. Coronary artery calcifications.  Hepatobiliary: No focal hepatic lesions. No intra or extrahepatic biliary ductal dilation. Normal gallbladder.  Pancreas: No focal lesions or main ductal dilation.  Spleen: Normal in size without focal abnormality.  Adrenals/Urinary Tract: Diffuse thickening of the right adrenal gland without discrete nodule. No left adrenal nodule. No suspicious renal mass on this noncontrast enhanced examination. Mild left hydronephrosis upstream of a 1.9 cm your pelvic junction stone. Mild asymmetric left perinephric stranding and free fluid. Under distended urinary bladder with mild pericystic stranding.  Stomach/Bowel: Normal appearance of the stomach. No evidence of bowel wall thickening, distention, or inflammatory changes. Colonic diverticulosis without acute diverticulitis. Normal appendix.  Vascular/Lymphatic: Aortic atherosclerosis. No enlarged abdominal or pelvic lymph nodes.  Reproductive: Prostate is unremarkable.  Other: No free air or fluid collection.  Musculoskeletal: No acute or abnormal lytic or blastic osseous lesions. Multilevel degenerative changes of the  partially imaged thoracic and lumbar spine. Small fat-containing bilateral inguinal hernias.  IMPRESSION: 1. Mild left hydronephrosis upstream of a 1.9 cm ureteropelvic junction stone. Mild asymmetric left perinephric stranding and free fluid, which may be reactive or related to ascending infection. 2. Under distended urinary bladder with mild pericystic stranding, which may be related to underdistention or cystitis. Recommend correlation with urinalysis. 3. Colonic diverticulosis without acute diverticulitis. 4.  Aortic Atherosclerosis (ICD10-I70.0).  Electronically Signed   By: Limin  Xu M.D.   On: 03/09/2024 13:36  Lab Results  Component Value Date   WBC 18.8 (H) 03/09/2024   HGB 16.4 03/09/2024   HCT 48.5 03/09/2024   MCV 91.9 03/09/2024   PLT 279 03/09/2024   Last metabolic panel Lab Results  Component Value Date   GLUCOSE 205 (H) 03/09/2024   NA 139 03/09/2024   K 3.6 03/09/2024   CL 107 03/09/2024   CO2 21 (L) 03/09/2024   BUN 56 (H) 03/09/2024   CREATININE 2.59 (H) 03/09/2024   GFRNONAA 25 (L) 03/09/2024   CALCIUM 9.8 03/09/2024   ANIONGAP 11 03/09/2024    Assessment and Plan: * Obstructive uropathy Nephrolithiasis  Noted L sided worsening flank pain with noted Mild left hydronephrosis upstream of a 1.9 cm ureteropelvic junction stone with perinephric stranding  Dr. Cherylene Corrente with urology notified  Planing operative intervention  Will place on IV Rocephin  Urine culture  Pain control  Antiemetics  Follow up urology recommendations   Acute kidney injury superimposed on chronic kidney disease (HCC) Baseline Cr 1.7-2  Cr 2.6 on presentation  Likely secondary to obstructive uropathy  Started on IVF  Monitor  Hold nephrotoxic agents    T2DM (type 2 diabetes mellitus) (HCC) Blood sugars low 200s  SSI  A1C  Monitor       Advance Care Planning:   Code Status: Full Code   Consults: Urology  Family Communication: Family at the bedside    Severity of Illness: The appropriate patient status for this patient is OBSERVATION. Observation status is judged to be reasonable and necessary in order to provide the required intensity of service to ensure the patient's safety. The patient's presenting symptoms, physical exam findings, and initial radiographic and laboratory data in the context of their medical condition is felt to place them at decreased risk for further clinical deterioration. Furthermore, it is anticipated that the patient will be medically stable for discharge from the hospital within 2 midnights of admission.   Author: Corrinne Din,  MD 03/09/2024 5:54 PM  For on call review [email or web address].

## 2024-03-09 NOTE — Sepsis Progress Note (Signed)
 Code Sepsis protocol being monitored by eLink.

## 2024-03-09 NOTE — Consult Note (Signed)
 Urology Consult  Requesting physician: Corena Herter, MD  Reason for consultation: Obstructing left renal calculus with infection   Assessment/Recommendations:  1.  Left UPJ calculus Obstructing stone with mild hydronephrosis, leukocytosis, pyuria and low-grade fever Recommend cystoscopy with placement left ureteral stent.  The procedure was discussed in detail including potential risks of bleeding, sepsis and rarely ureteral injury.  The possibility of storage related voiding symptoms, bladder spasm and flank pain secondary to an indwelling stent were discussed.  We discussed possible need for percutaneous nephrostomy placement by interventional radiology for an impacted stone with inability to obtain access proximal to the obstructing stone We discussed due to infection no attempt will be made at stone removal due to the high risk of sepsis/septic shock and he will need a follow-up procedure for definitive stone treatment  2.  Urinary tract infection Pyuria, leukocytosis and subjective fever with obstructing stone Ureteral stent placement as above  3.  Acute kidney injury on CKD Most likely multifactorial including dehydration and obstruction    History of Present Illness: Stephen Mahoney is a 78 y.o. male with a history of recurrent stone disease, diabetes, CKD presented to the ER today with a several day history of left flank pain worse in the last 24 hours.  Mild subjective fever/chills.  Evaluation in the ED remarkable for leukocytosis at 18.8 and urinalysis showing >50 WBC.  CT abdomen pelvis without contrast remarkable for a 19 mm left renal pelvic calculus with mild hydronephrosis and mild left perinephric stranding.  Lactate levels normal x 2.  Afebrile since hospitalized and started on IV Rocephin.  Past Medical History:  Diagnosis Date   Actinic keratosis    Basal cell carcinoma 05/07/2013   Right mid to distal dorsum nose.    Basal cell carcinoma 01/15/2018   Left  medial pectoral. Nodular, ulcerated.   Basal cell carcinoma 04/21/2020   Left mid lat. pretibial. Nodular. EDC.   BPH (benign prostatic hyperplasia)    Chronic kidney disease    Chronic kidney disease    Diabetes mellitus without complication (HCC)    High blood triglycerides    Hyperlipidemia with low HDL    Hypertension    Obesity     Past Surgical History:  Procedure Laterality Date   COLON SURGERY     COLONOSCOPY N/A 02/01/2022   Procedure: COLONOSCOPY;  Surgeon: Jaynie Collins, DO;  Location: [facility] ENDOSCOPY;  Service: Gastroenterology;  Laterality: N/A;  DM   COLONOSCOPY W/ POLYPECTOMY     COLONOSCOPY WITH PROPOFOL N/A 04/12/2016   Procedure: COLONOSCOPY WITH PROPOFOL;  Surgeon: Scot Jun, MD;  Location: [facility] ENDOSCOPY;  Service: Endoscopy;  Laterality: N/A;   MM BCCCP - DIAG MAMMO W/CAD ([facility] HX) N/A    left lip nose   TONSILLECTOMY      Home Medications:  Current Meds  Medication Sig   amLODipine (NORVASC) 5 MG tablet Take 10 mg by mouth daily.   doxycycline (PERIOSTAT) 20 MG tablet TAKE ONE TABLET BY MOUTH TWICE DAILY WITH FOOD   dutasteride (AVODART) 0.5 MG capsule Take 0.5 mg by mouth daily.   FARXIGA 10 MG TABS tablet Take 10 mg by mouth daily.   glimepiride (AMARYL) 4 MG tablet Take 4 mg by mouth daily with breakfast.   insulin lispro (HUMALOG) 100 UNIT/ML injection 5 Units 3 (three) times daily with meals.   ketoconazole (NIZORAL) 2 % cream Apply to feet QHS. (Patient taking differently: Apply 1 Application topically daily as needed for irritation. Apply  to feet QHS.)   LANTUS SOLOSTAR 100 UNIT/ML Solostar Pen Inject 15 Units into the skin at bedtime.   lovastatin (MEVACOR) 20 MG tablet Take 20 mg by mouth at bedtime.   tamsulosin (FLOMAX) 0.4 MG CAPS capsule Take 0.4 mg by mouth.   traZODone (DESYREL) 100 MG tablet Take 100 mg by mouth at bedtime.   valsartan-hydrochlorothiazide (DIOVAN-HCT) 320-25 MG tablet Take 1 tablet by mouth daily.     Allergies:  Allergies  Allergen Reactions   Sulfa Antibiotics Swelling and Rash    History reviewed. No pertinent family history.  Social History:  reports that he quit smoking about 9 years ago. He does not have any smokeless tobacco history on file. He reports that he does not drink alcohol and does not use drugs.  ROS: A complete review of systems was performed.  All systems are negative except for pertinent findings as noted.  Physical Exam:  Vital signs in last 24 hours: Temp:  [98 F (36.7 C)-98.6 F (37 C)] 98.3 F (36.8 C) (04/14 2012) Pulse Rate:  [50-62] 53 (04/14 2012) Resp:  [16-18] 16 (04/14 2012) BP: (121-137)/(63-95) 121/68 (04/14 2012) SpO2:  [92 %-95 %] 92 % (04/14 2012) Weight:  [136.1 kg] 136.1 kg (04/14 1028) Constitutional:  Alert and oriented, No acute distress HEENT: [place] AT Cardiovascular: Regular rate and rhythm, no clubbing, cyanosis, or edema. Respiratory: Normal respiratory effort, lungs clear bilaterally Psychiatric: Normal mood and affect   Laboratory Data:  Recent Labs    03/09/24 1030  WBC 18.8*  HGB 16.4  HCT 48.5   Recent Labs    03/09/24 1030  NA 139  K 3.6  CL 107  CO2 21*  GLUCOSE 205*  BUN 56*  CREATININE 2.59*  CALCIUM 9.8     Radiologic Imaging: CT images were personally reviewed and interpreted  CT ABDOMEN PELVIS WO CONTRAST Result Date: 03/09/2024 CLINICAL DATA:  History of lithotripsy with acute onset left flank pain EXAM: CT ABDOMEN AND PELVIS WITHOUT CONTRAST TECHNIQUE: Multidetector CT imaging of the abdomen and pelvis was performed following the standard protocol without IV contrast. RADIATION DOSE REDUCTION: This exam was performed according to the departmental dose-optimization program which includes automated exposure control, adjustment of the mA and/or kV according to patient size and/or use of iterative reconstruction technique. COMPARISON:  CT abdomen and pelvis dated 12/03/2006 FINDINGS: Lower chest:  No focal consolidation or pulmonary nodule in the lung bases. No pleural effusion or pneumothorax demonstrated. Partially imaged heart size is normal. Coronary artery calcifications. Hepatobiliary: No focal hepatic lesions. No intra or extrahepatic biliary ductal dilation. Normal gallbladder. Pancreas: No focal lesions or main ductal dilation. Spleen: Normal in size without focal abnormality. Adrenals/Urinary Tract: Diffuse thickening of the right adrenal gland without discrete nodule. No left adrenal nodule. No suspicious renal mass on this noncontrast enhanced examination. Mild left hydronephrosis upstream of a 1.9 cm your pelvic junction stone. Mild asymmetric left perinephric stranding and free fluid. Under distended urinary bladder with mild pericystic stranding. Stomach/Bowel: Normal appearance of the stomach. No evidence of bowel wall thickening, distention, or inflammatory changes. Colonic diverticulosis without acute diverticulitis. Normal appendix. Vascular/Lymphatic: Aortic atherosclerosis. No enlarged abdominal or pelvic lymph nodes. Reproductive: Prostate is unremarkable. Other: No free air or fluid collection. Musculoskeletal: No acute or abnormal lytic or blastic osseous lesions. Multilevel degenerative changes of the partially imaged thoracic and lumbar spine. Small fat-containing bilateral inguinal hernias. IMPRESSION: 1. Mild left hydronephrosis upstream of a 1.9 cm ureteropelvic junction stone. Mild asymmetric left  perinephric stranding and free fluid, which may be reactive or related to ascending infection. 2. Under distended urinary bladder with mild pericystic stranding, which may be related to underdistention or cystitis. Recommend correlation with urinalysis. 3. Colonic diverticulosis without acute diverticulitis. 4.  Aortic Atherosclerosis (ICD10-I70.0). Electronically Signed   By: Limin  Xu M.D.   On: 03/09/2024 13:36      03/09/2024, 10:08 PM  Darlynn Elam,  MD

## 2024-03-09 NOTE — ED Triage Notes (Signed)
 Pt states L sided flank pain that started at 0300, pt states HX of same, pt denies hematuria. Pt states HX of lithotripsy.

## 2024-03-09 NOTE — Assessment & Plan Note (Addendum)
 Baseline Cr 1.7-2  Cr 2.6 on presentation  Likely secondary to obstructive uropathy  Started on IVF  Monitor  Hold nephrotoxic agents

## 2024-03-09 NOTE — H&P (View-Only) (Signed)
 Urology Consult  Requesting physician: Corena Herter, MD  Reason for consultation: Obstructing left renal calculus with infection   Assessment/Recommendations:  1.  Left UPJ calculus Obstructing stone with mild hydronephrosis, leukocytosis, pyuria and low-grade fever Recommend cystoscopy with placement left ureteral stent.  The procedure was discussed in detail including potential risks of bleeding, sepsis and rarely ureteral injury.  The possibility of storage related voiding symptoms, bladder spasm and flank pain secondary to an indwelling stent were discussed.  We discussed possible need for percutaneous nephrostomy placement by interventional radiology for an impacted stone with inability to obtain access proximal to the obstructing stone We discussed due to infection no attempt will be made at stone removal due to the high risk of sepsis/septic shock and he will need a follow-up procedure for definitive stone treatment  2.  Urinary tract infection Pyuria, leukocytosis and subjective fever with obstructing stone Ureteral stent placement as above  3.  Acute kidney injury on CKD Most likely multifactorial including dehydration and obstruction    History of Present Illness: Stephen Mahoney is a 78 y.o. male with a history of recurrent stone disease, diabetes, CKD presented to the ER today with a several day history of left flank pain worse in the last 24 hours.  Mild subjective fever/chills.  Evaluation in the ED remarkable for leukocytosis at 18.8 and urinalysis showing >50 WBC.  CT abdomen pelvis without contrast remarkable for a 19 mm left renal pelvic calculus with mild hydronephrosis and mild left perinephric stranding.  Lactate levels normal x 2.  Afebrile since hospitalized and started on IV Rocephin.  Past Medical History:  Diagnosis Date   Actinic keratosis    Basal cell carcinoma 05/07/2013   Right mid to distal dorsum nose.    Basal cell carcinoma 01/15/2018   Left  medial pectoral. Nodular, ulcerated.   Basal cell carcinoma 04/21/2020   Left mid lat. pretibial. Nodular. EDC.   BPH (benign prostatic hyperplasia)    Chronic kidney disease    Chronic kidney disease    Diabetes mellitus without complication (HCC)    High blood triglycerides    Hyperlipidemia with low HDL    Hypertension    Obesity     Past Surgical History:  Procedure Laterality Date   COLON SURGERY     COLONOSCOPY N/A 02/01/2022   Procedure: COLONOSCOPY;  Surgeon: Jaynie Collins, DO;  Location[facility] ENDOSCOPY;  Service: Gastroenterology;  Laterality: N/A;  DM   COLONOSCOPY W/ POLYPECTOMY     COLONOSCOPY WITH PROPOFOL N/A 04/12/2016   Procedure: COLONOSCOPY WITH PROPOFOL;  Surgeon: Scot Jun, MD;  Location: [facility] ENDOSCOPY;  Service: Endoscopy;  Laterality: N/A;   MM BCCCP - DIAG MAMMO W/CAD ([facility] HX) N/A    left lip nose   TONSILLECTOMY      Home Medications:  Current Meds  Medication Sig   amLODipine (NORVASC) 5 MG tablet Take 10 mg by mouth daily.   doxycycline (PERIOSTAT) 20 MG tablet TAKE ONE TABLET BY MOUTH TWICE DAILY WITH FOOD   dutasteride (AVODART) 0.5 MG capsule Take 0.5 mg by mouth daily.   FARXIGA 10 MG TABS tablet Take 10 mg by mouth daily.   glimepiride (AMARYL) 4 MG tablet Take 4 mg by mouth daily with breakfast.   insulin lispro (HUMALOG) 100 UNIT/ML injection 5 Units 3 (three) times daily with meals.   ketoconazole (NIZORAL) 2 % cream Apply to feet QHS. (Patient taking differently: Apply 1 Application topically daily as needed for irritation. Apply  to feet QHS.)   LANTUS SOLOSTAR 100 UNIT/ML Solostar Pen Inject 15 Units into the skin at bedtime.   lovastatin (MEVACOR) 20 MG tablet Take 20 mg by mouth at bedtime.   tamsulosin (FLOMAX) 0.4 MG CAPS capsule Take 0.4 mg by mouth.   traZODone (DESYREL) 100 MG tablet Take 100 mg by mouth at bedtime.   valsartan-hydrochlorothiazide (DIOVAN-HCT) 320-25 MG tablet Take 1 tablet by mouth daily.     Allergies:  Allergies  Allergen Reactions   Sulfa Antibiotics Swelling and Rash    History reviewed. No pertinent family history.  Social History:  reports that he quit smoking about 9 years ago. He does not have any smokeless tobacco history on file. He reports that he does not drink alcohol and does not use drugs.  ROS: A complete review of systems was performed.  All systems are negative except for pertinent findings as noted.  Physical Exam:  Vital signs in last 24 hours: Temp:  [98 F (36.7 C)-98.6 F (37 C)] 98.3 F (36.8 C) (04/14 2012) Pulse Rate:  [50-62] 53 (04/14 2012) Resp:  [16-18] 16 (04/14 2012) BP: (121-137)/(63-95) 121/68 (04/14 2012) SpO2:  [92 %-95 %] 92 % (04/14 2012) Weight:  [136.1 kg] 136.1 kg (04/14 1028) Constitutional:  Alert and oriented, No acute distress HEENT: [place] AT Cardiovascular: Regular rate and rhythm, no clubbing, cyanosis, or edema. Respiratory: Normal respiratory effort, lungs clear bilaterally Psychiatric: Normal mood and affect   Laboratory Data:  Recent Labs    03/09/24 1030  WBC 18.8*  HGB 16.4  HCT 48.5   Recent Labs    03/09/24 1030  NA 139  K 3.6  CL 107  CO2 21*  GLUCOSE 205*  BUN 56*  CREATININE 2.59*  CALCIUM 9.8     Radiologic Imaging: CT images were personally reviewed and interpreted  CT ABDOMEN PELVIS WO CONTRAST Result Date: 03/09/2024 CLINICAL DATA:  History of lithotripsy with acute onset left flank pain EXAM: CT ABDOMEN AND PELVIS WITHOUT CONTRAST TECHNIQUE: Multidetector CT imaging of the abdomen and pelvis was performed following the standard protocol without IV contrast. RADIATION DOSE REDUCTION: This exam was performed according to the departmental dose-optimization program which includes automated exposure control, adjustment of the mA and/or kV according to patient size and/or use of iterative reconstruction technique. COMPARISON:  CT abdomen and pelvis dated 12/03/2006 FINDINGS: Lower chest:  No focal consolidation or pulmonary nodule in the lung bases. No pleural effusion or pneumothorax demonstrated. Partially imaged heart size is normal. Coronary artery calcifications. Hepatobiliary: No focal hepatic lesions. No intra or extrahepatic biliary ductal dilation. Normal gallbladder. Pancreas: No focal lesions or main ductal dilation. Spleen: Normal in size without focal abnormality. Adrenals/Urinary Tract: Diffuse thickening of the right adrenal gland without discrete nodule. No left adrenal nodule. No suspicious renal mass on this noncontrast enhanced examination. Mild left hydronephrosis upstream of a 1.9 cm your pelvic junction stone. Mild asymmetric left perinephric stranding and free fluid. Under distended urinary bladder with mild pericystic stranding. Stomach/Bowel: Normal appearance of the stomach. No evidence of bowel wall thickening, distention, or inflammatory changes. Colonic diverticulosis without acute diverticulitis. Normal appendix. Vascular/Lymphatic: Aortic atherosclerosis. No enlarged abdominal or pelvic lymph nodes. Reproductive: Prostate is unremarkable. Other: No free air or fluid collection. Musculoskeletal: No acute or abnormal lytic or blastic osseous lesions. Multilevel degenerative changes of the partially imaged thoracic and lumbar spine. Small fat-containing bilateral inguinal hernias. IMPRESSION: 1. Mild left hydronephrosis upstream of a 1.9 cm ureteropelvic junction stone. Mild asymmetric left  perinephric stranding and free fluid, which may be reactive or related to ascending infection. 2. Under distended urinary bladder with mild pericystic stranding, which may be related to underdistention or cystitis. Recommend correlation with urinalysis. 3. Colonic diverticulosis without acute diverticulitis. 4.  Aortic Atherosclerosis (ICD10-I70.0). Electronically Signed   By: Limin  Xu M.D.   On: 03/09/2024 13:36      03/09/2024, 10:08 PM  Darlynn Elam,  MD

## 2024-03-09 NOTE — ED Provider Notes (Signed)
 [facility] Provider Note    Event Date/Time   First MD Initiated Contact with Patient 03/09/24 1257     (approximate)   History   Flank Pain   HPI  Stephen Mahoney is a 78 y.o. male past medical history significant for prior kidney stone who presents to the emergency department with left flank pain.  Patient states last night started having left-sided abdominal pain.  Ongoing today.  Associated with nausea and episodes of vomiting.  Denies dysuria, urinary urgency or frequency.  No fever or chills.  Prior lithotripsy.     Physical Exam   Triage Vital Signs: ED Triage Vitals  Encounter Vitals Group     BP 03/09/24 1030 (!) 124/95     Systolic BP Percentile --      Diastolic BP Percentile --      Pulse Rate 03/09/24 1030 62     Resp 03/09/24 1028 18     Temp 03/09/24 1028 98 F (36.7 C)     Temp Source 03/09/24 1028 Oral     SpO2 03/09/24 1030 95 %     Weight 03/09/24 1028 300 lb (136.1 kg)     Height 03/09/24 1028 6\' 2"  (1.88 m)     Head Circumference --      Peak Flow --      Pain Score 03/09/24 1028 6     Pain Loc --      Pain Education --      Exclude from Growth Chart --     Most recent vital signs: Vitals:   03/09/24 1028 03/09/24 1030  BP:  (!) 124/95  Pulse:  62  Resp: 18   Temp: 98 F (36.7 C)   SpO2:  95%    Physical Exam Constitutional:      Appearance: He is well-developed.  HENT:     Head: Atraumatic.  Eyes:     Conjunctiva/sclera: Conjunctivae normal.  Cardiovascular:     Rate and Rhythm: Regular rhythm.  Pulmonary:     Effort: No respiratory distress.  Abdominal:     Tenderness: There is abdominal tenderness. There is left CVA tenderness.  Musculoskeletal:     Cervical back: Normal range of motion.  Skin:    General: Skin is warm.     Capillary Refill: Capillary refill takes less than 2 seconds.  Neurological:     Mental Status: He is alert. Mental status is at baseline.     IMPRESSION / MDM /  ASSESSMENT AND PLAN / ED COURSE  I reviewed the triage vital signs and the nursing notes.  Differential diagnosis include kidney stone, infected kidney stone, pyelonephritis  RADIOLOGY I independently reviewed imaging, my interpretation of imaging: CT scan with 1.9 cm kidney stone with hydronephrosis and stranding.  LABS (all labs ordered are listed, but only abnormal results are displayed) Labs interpreted as -    Labs Reviewed  URINALYSIS, ROUTINE W REFLEX MICROSCOPIC - Abnormal; Notable for the following components:      Result Value   Color, Urine YELLOW (*)    APPearance CLOUDY (*)    Glucose, UA >=500 (*)    Hgb urine dipstick SMALL (*)    Protein, ur 100 (*)    Leukocytes,Ua MODERATE (*)    Bacteria, UA RARE (*)    All other components within normal limits  BASIC METABOLIC PANEL WITH GFR - Abnormal; Notable for the following components:   CO2 21 (*)    Glucose, Bld 205 (*)  BUN 56 (*)    Creatinine, Ser 2.59 (*)    GFR, Estimated 25 (*)    All other components within normal limits  CBC - Abnormal; Notable for the following components:   WBC 18.8 (*)    All other components within normal limits  CULTURE, BLOOD (ROUTINE X 2)  CULTURE, BLOOD (ROUTINE X 2)  CULTURE, BLOOD (SINGLE)  LACTIC ACID, PLASMA  LACTIC ACID, PLASMA     MDM  Patient with significant leukocytosis of 18.  Creatinine elevated to 2.6 from a baseline of 2.  Does have an elevated BUN.  Findings of a urinary tract infection.  Added on cultures and lactic acid.  Concern for infection and given IV Rocephin.  Discussed the patient's case with urology on-call Dr. Cherylene Corrente who recommended admission to the hospitalist for IV antibiotics and plan for stent placement tomorrow.     PROCEDURES:  Critical Care performed: yes  .Critical Care  Performed by: Viviano Ground, MD Authorized by: Viviano Ground, MD   Critical care provider statement:    Critical care time (minutes):  30   Critical care time  was exclusive of:  Separately billable procedures and treating other patients   Critical care was necessary to treat or prevent imminent or life-threatening deterioration of the following conditions:  Sepsis   Critical care was time spent personally by me on the following activities:  Development of treatment plan with patient or surrogate, discussions with consultants, evaluation of patient's response to treatment, examination of patient, ordering and review of laboratory studies, ordering and review of radiographic studies, ordering and performing treatments and interventions, pulse oximetry, re-evaluation of patient's condition and review of old charts   Care discussed with: admitting provider     Patient's presentation is most consistent with acute presentation with potential threat to life or bodily function.   MEDICATIONS ORDERED IN ED: Medications  lactated ringers infusion (has no administration in time range)  cefTRIAXone (ROCEPHIN) 2 g in sodium chloride 0.9 % 100 mL IVPB (has no administration in time range)  HYDROmorphone (DILAUDID) injection 1 mg (has no administration in time range)  sodium chloride 0.9 % bolus 1,000 mL (1,000 mLs Intravenous New Bag/Given 03/09/24 1425)  morphine (PF) 4 MG/ML injection 4 mg (4 mg Intravenous Given 03/09/24 1426)  ondansetron (ZOFRAN) injection 4 mg (4 mg Intravenous Given 03/09/24 1426)    FINAL CLINICAL IMPRESSION(S) / ED DIAGNOSES   Final diagnoses:  Kidney stone  Complicated UTI (urinary tract infection)     Rx / DC Orders   ED Discharge Orders     None        Note:  This document was prepared using Dragon voice recognition software and may include unintentional dictation errors.   Viviano Ground, MD 03/09/24 1510

## 2024-03-09 NOTE — Consult Note (Signed)
 CODE SEPSIS - PHARMACY COMMUNICATION  **Broad-spectrum antimicrobials should be administered within one hour of sepsis diagnosis**  Time Code Sepsis call or page was received: 1444  Antibiotics ordered: Ceftriaxone  Time of first antibiotic administration: 1530  Additional action taken by pharmacy: N/A  If necessary, name of provider/nurse contacted: N/A    Will M. Alva Jewels, PharmD Clinical Pharmacist 03/09/2024 3:09 PM

## 2024-03-09 NOTE — Progress Notes (Signed)
 PHARMACIST - PHYSICIAN COMMUNICATION  CONCERNING:  Enoxaparin (Lovenox) for DVT Prophylaxis   ASSESSMENT: Patient was prescribed enoxaparin 40 mg subcutaneously every 24 hours for VTE prophylaxis.   Body mass index is 38.52 kg/m.  Estimated Creatinine Clearance: 36.2 mL/min (A) (by C-G formula based on SCr of 2.59 mg/dL (H)).  Based on [facility] policy, patient qualifies for enoxaparin dosing of 0.5 mg per kilogram of total body weight every 24 hours because their body mass index is >30 kg/m2.  PLAN: Pharmacy has adjusted enoxaparin dose per [facility] policy.  Description: Patient is now receiving enoxaparin 0.5 mg/kg subcutaneously every 24 hours.  Will M. Alva Jewels, PharmD Clinical Pharmacist 03/09/2024 5:38 PM

## 2024-03-09 NOTE — Assessment & Plan Note (Addendum)
 Nephrolithiasis  Noted L sided worsening flank pain with noted Mild left hydronephrosis upstream of a 1.9 cm ureteropelvic junction stone with perinephric stranding  Dr. Cherylene Corrente with urology notified  Planing operative intervention  Will place on IV Rocephin  Urine culture  Pain control  Antiemetics  Follow up urology recommendations

## 2024-03-09 NOTE — Assessment & Plan Note (Signed)
 Blood sugars low 200s  SSI  A1C  Monitor

## 2024-03-10 ENCOUNTER — Inpatient Hospital Stay: Admitting: Anesthesiology

## 2024-03-10 ENCOUNTER — Encounter
Admission: EM | Disposition: A | Discharge status: Home / Self Care | Payer: Self-pay | Source: Home / Self Care | Attending: Internal Medicine

## 2024-03-10 ENCOUNTER — Encounter: Payer: Self-pay | Admitting: Family Medicine

## 2024-03-10 ENCOUNTER — Inpatient Hospital Stay

## 2024-03-10 DIAGNOSIS — N3000 Acute cystitis without hematuria: Secondary | ICD-10-CM

## 2024-03-10 DIAGNOSIS — N12 Tubulo-interstitial nephritis, not specified as acute or chronic: Secondary | ICD-10-CM

## 2024-03-10 DIAGNOSIS — E119 Type 2 diabetes mellitus without complications: Secondary | ICD-10-CM

## 2024-03-10 DIAGNOSIS — N139 Obstructive and reflux uropathy, unspecified: Secondary | ICD-10-CM | POA: Diagnosis not present

## 2024-03-10 DIAGNOSIS — Z794 Long term (current) use of insulin: Secondary | ICD-10-CM

## 2024-03-10 DIAGNOSIS — N189 Chronic kidney disease, unspecified: Secondary | ICD-10-CM

## 2024-03-10 DIAGNOSIS — N201 Calculus of ureter: Secondary | ICD-10-CM | POA: Diagnosis not present

## 2024-03-10 DIAGNOSIS — N179 Acute kidney failure, unspecified: Secondary | ICD-10-CM | POA: Diagnosis not present

## 2024-03-10 DIAGNOSIS — Z01810 Encounter for preprocedural cardiovascular examination: Secondary | ICD-10-CM

## 2024-03-10 HISTORY — PX: CYSTOSCOPY W/ RETROGRADES: SHX1426

## 2024-03-10 HISTORY — PX: CYSTOSCOPY WITH STENT PLACEMENT: SHX5790

## 2024-03-10 LAB — COMPREHENSIVE METABOLIC PANEL WITH GFR
ALT: 16 U/L (ref 0–44)
AST: 15 U/L (ref 15–41)
Albumin: 3.1 g/dL — ABNORMAL LOW (ref 3.5–5.0)
Alkaline Phosphatase: 33 U/L — ABNORMAL LOW (ref 38–126)
Anion gap: 8 (ref 5–15)
BUN: 57 mg/dL — ABNORMAL HIGH (ref 8–23)
CO2: 22 mmol/L (ref 22–32)
Calcium: 8.9 mg/dL (ref 8.9–10.3)
Chloride: 106 mmol/L (ref 98–111)
Creatinine, Ser: 2.99 mg/dL — ABNORMAL HIGH (ref 0.61–1.24)
GFR, Estimated: 21 mL/min — ABNORMAL LOW (ref >60)
Glucose, Bld: 185 mg/dL — ABNORMAL HIGH (ref 70–99)
Potassium: 3.9 mmol/L (ref 3.5–5.1)
Sodium: 136 mmol/L (ref 135–145)
Total Bilirubin: 0.9 mg/dL (ref 0.0–1.2)
Total Protein: 6.7 g/dL (ref 6.5–8.1)

## 2024-03-10 LAB — GLUCOSE, CAPILLARY
Glucose-Capillary: 198 mg/dL — ABNORMAL HIGH (ref 70–99)
Glucose-Capillary: 175 mg/dL — ABNORMAL HIGH (ref 70–99)
Glucose-Capillary: 186 mg/dL — ABNORMAL HIGH (ref 70–99)
Glucose-Capillary: 127 mg/dL — ABNORMAL HIGH (ref 70–99)
Glucose-Capillary: 97 mg/dL (ref 70–99)

## 2024-03-10 LAB — BLOOD CULTURE ID PANEL (REFLEXED) - BCID2

## 2024-03-10 LAB — CBC
HCT: 42.9 % (ref 39.0–52.0)
Hemoglobin: 14.4 g/dL (ref 13.0–17.0)
MCH: 31.3 pg (ref 26.0–34.0)
MCHC: 33.6 g/dL (ref 30.0–36.0)
MCV: 93.3 fL (ref 80.0–100.0)
Platelets: 219 10*3/uL (ref 150–400)
RBC: 4.6 MIL/uL (ref 4.22–5.81)
RDW: 13.4 % (ref 11.5–15.5)
WBC: 17.6 10*3/uL — ABNORMAL HIGH (ref 4.0–10.5)
nRBC: 0 % (ref 0.0–0.2)

## 2024-03-10 SURGERY — CYSTOSCOPY, WITH STENT INSERTION
Anesthesia: General | Site: Penis

## 2024-03-10 MED ORDER — INSULIN ASPART 100 UNIT/ML IJ SOLN
4.0000 [IU] | Freq: Once | INTRAMUSCULAR | Status: AC
  Administered 2024-03-10: 4 [IU] via SUBCUTANEOUS

## 2024-03-10 MED ORDER — FENTANYL CITRATE (PF) 100 MCG/2ML IJ SOLN
INTRAMUSCULAR | Status: AC
  Filled 2024-03-10: qty 2

## 2024-03-10 MED ORDER — PROPOFOL 1000 MG/100ML IV EMUL
INTRAVENOUS | Status: AC
  Filled 2024-03-10: qty 100

## 2024-03-10 MED ORDER — TAMSULOSIN HCL 0.4 MG PO CAPS
0.4000 mg | ORAL_CAPSULE | Freq: Every day | ORAL | Status: DC
  Administered 2024-03-10 – 2024-03-12 (×3): 0.4 mg via ORAL
  Filled 2024-03-10 (×3): qty 1

## 2024-03-10 MED ORDER — LIDOCAINE HCL URETHRAL/MUCOSAL 2 % EX GEL
CUTANEOUS | Status: AC
  Filled 2024-03-10: qty 10

## 2024-03-10 MED ORDER — SENNOSIDES-DOCUSATE SODIUM 8.6-50 MG PO TABS
2.0000 | ORAL_TABLET | Freq: Two times a day (BID) | ORAL | Status: DC
  Administered 2024-03-10 – 2024-03-11 (×4): 2 via ORAL
  Filled 2024-03-10 (×4): qty 2

## 2024-03-10 MED ORDER — CHLORHEXIDINE GLUCONATE 0.12 % MT SOLN
OROMUCOSAL | Status: AC
  Filled 2024-03-10: qty 15

## 2024-03-10 MED ORDER — OXYBUTYNIN CHLORIDE 5 MG PO TABS: 5.0000 mg | ORAL_TABLET | Freq: Three times a day (TID) | ORAL | Status: DC | PRN

## 2024-03-10 MED ORDER — SODIUM CHLORIDE 0.9 % IV SOLN
2.0000 g | INTRAVENOUS | Status: DC
  Administered 2024-03-10 – 2024-03-11 (×2): 2 g via INTRAVENOUS
  Filled 2024-03-10 (×5): qty 20

## 2024-03-10 MED ORDER — OXYCODONE HCL 5 MG PO TABS: 5.0000 mg | ORAL_TABLET | Freq: Once | ORAL | Status: DC | PRN

## 2024-03-10 MED ORDER — PROPOFOL 10 MG/ML IV BOLUS
INTRAVENOUS | Status: AC
Start: 2024-03-10 — End: ?
  Filled 2024-03-10: qty 20

## 2024-03-10 MED ORDER — STERILE WATER FOR IRRIGATION IR SOLN
Status: DC | PRN
  Administered 2024-03-10: 500 mL

## 2024-03-10 MED ORDER — LIDOCAINE HCL (CARDIAC) PF 100 MG/5ML IV SOSY
PREFILLED_SYRINGE | INTRAVENOUS | Status: DC | PRN
  Administered 2024-03-10: 100 mg via INTRAVENOUS

## 2024-03-10 MED ORDER — SODIUM CHLORIDE 0.9 % IV SOLN: INTRAVENOUS | Status: DC

## 2024-03-10 MED ORDER — OXYCODONE HCL 5 MG/5ML PO SOLN: 5.0000 mg | Freq: Once | ORAL | Status: DC | PRN

## 2024-03-10 MED ORDER — IOHEXOL 180 MG/ML  SOLN
INTRAMUSCULAR | Status: DC | PRN
  Administered 2024-03-10: 10 mL

## 2024-03-10 MED ORDER — LIDOCAINE HCL URETHRAL/MUCOSAL 2 % EX GEL
CUTANEOUS | Status: DC | PRN
  Administered 2024-03-10: 1 via TOPICAL

## 2024-03-10 MED ORDER — DROPERIDOL 2.5 MG/ML IJ SOLN: 0.6250 mg | Freq: Once | INTRAMUSCULAR | Status: DC | PRN

## 2024-03-10 MED ORDER — POLYETHYLENE GLYCOL 3350 17 G PO PACK
17.0000 g | PACK | Freq: Two times a day (BID) | ORAL | Status: DC
  Administered 2024-03-10 – 2024-03-11 (×3): 17 g via ORAL
  Filled 2024-03-10 (×3): qty 1

## 2024-03-10 MED ORDER — LIDOCAINE HCL URETHRAL/MUCOSAL 2 % EX GEL
CUTANEOUS | Status: DC | PRN
Start: 2024-03-10 — End: 2024-03-10
  Administered 2024-03-10: 1 via URETHRAL

## 2024-03-10 MED ORDER — FENTANYL CITRATE (PF) 100 MCG/2ML IJ SOLN
INTRAMUSCULAR | Status: DC | PRN
  Administered 2024-03-10 (×2): 50 ug via INTRAVENOUS

## 2024-03-10 MED ORDER — FENTANYL CITRATE PF 50 MCG/ML IJ SOSY
50.0000 ug | PREFILLED_SYRINGE | INTRAMUSCULAR | Status: DC | PRN
  Administered 2024-03-10: 50 ug via INTRAVENOUS

## 2024-03-10 MED ORDER — FENTANYL CITRATE PF 50 MCG/ML IJ SOSY
PREFILLED_SYRINGE | INTRAMUSCULAR | Status: AC
  Filled 2024-03-10: qty 1

## 2024-03-10 MED ORDER — PROPOFOL 10 MG/ML IV BOLUS
INTRAVENOUS | Status: DC | PRN
  Administered 2024-03-10: 100 ug/kg/min via INTRAVENOUS

## 2024-03-10 MED ORDER — ACETAMINOPHEN 500 MG PO TABS
ORAL_TABLET | ORAL | Status: AC
  Filled 2024-03-10: qty 2

## 2024-03-10 MED ORDER — EPHEDRINE SULFATE-NACL 50-0.9 MG/10ML-% IV SOSY
PREFILLED_SYRINGE | INTRAVENOUS | Status: DC | PRN
Start: 2024-03-10 — End: 2024-03-10
  Administered 2024-03-10: 10 mg via INTRAVENOUS

## 2024-03-10 MED ORDER — FENTANYL CITRATE (PF) 100 MCG/2ML IJ SOLN: 25.0000 ug | INTRAMUSCULAR | Status: DC | PRN

## 2024-03-10 MED ORDER — INSULIN ASPART 100 UNIT/ML IJ SOLN
INTRAMUSCULAR | Status: AC
  Filled 2024-03-10: qty 1

## 2024-03-10 MED ORDER — ACETAMINOPHEN 10 MG/ML IV SOLN: 1000.0000 mg | Freq: Once | INTRAVENOUS | Status: DC | PRN

## 2024-03-10 MED ORDER — ACETAMINOPHEN 500 MG PO TABS
1000.0000 mg | ORAL_TABLET | Freq: Once | ORAL | Status: AC
  Administered 2024-03-10: 1000 mg via ORAL

## 2024-03-10 MED ORDER — GLYCOPYRROLATE 0.2 MG/ML IJ SOLN
INTRAMUSCULAR | Status: DC | PRN
  Administered 2024-03-10: .2 mg via INTRAVENOUS

## 2024-03-10 MED ORDER — SODIUM CHLORIDE 0.9 % IR SOLN
Status: DC | PRN
  Administered 2024-03-10: 3000 mL

## 2024-03-10 SURGICAL SUPPLY — 17 items
BAG DRAIN SIEMENS DORNER NS (MISCELLANEOUS) ×2 IMPLANT
BRUSH SCRUB EZ 4% CHG (MISCELLANEOUS) ×2 IMPLANT
CATH URETL OPEN END 6X70 (CATHETERS) ×2 IMPLANT
GLOVE BIOGEL PI IND STRL 7.5 (GLOVE) ×2 IMPLANT
GOWN STRL REUS W/ TWL XL LVL3 (GOWN DISPOSABLE) ×2 IMPLANT
GUIDEWIRE STR DUAL SENSOR (WIRE) ×2 IMPLANT
GUIDEWIRE STR ZIPWIRE 035X150 (MISCELLANEOUS) IMPLANT
KIT TURNOVER CYSTO (KITS) ×2 IMPLANT
PACK CYSTO AR (MISCELLANEOUS) ×2 IMPLANT
SET CYSTO W/LG BORE CLAMP LF (SET/KITS/TRAYS/PACK) ×2 IMPLANT
SOL .9 NS 3000ML IRR UROMATIC (IV SOLUTION) ×2 IMPLANT
STENT URET 6FRX24 CONTOUR (STENTS) IMPLANT
STENT URET 6FRX26 CONTOUR (STENTS) IMPLANT
STENT URET 6FRX30 CONTOUR (STENTS) IMPLANT
SURGILUBE 2OZ TUBE FLIPTOP (MISCELLANEOUS) ×2 IMPLANT
WATER STERILE IRR 1000ML POUR (IV SOLUTION) ×2 IMPLANT
WATER STERILE IRR 500ML POUR (IV SOLUTION) ×2 IMPLANT

## 2024-03-10 NOTE — Transfer of Care (Signed)
 Immediate Anesthesia Transfer of Care Note  Patient: Stephen Mahoney  Procedure(s) Performed: CYSTOSCOPY, WITH STENT INSERTION (Left: Penis) CYSTOSCOPY, WITH RETROGRADE PYELOGRAM (Penis)  Patient Location: PACU  Anesthesia Type:General  Level of Consciousness: awake, alert , and oriented  Airway & Oxygen Therapy: Patient Spontanous Breathing  Post-op Assessment: Report given to RN and Post -op Vital signs reviewed and stable  Post vital signs: stable  Last Vitals:  Vitals Value Taken Time  BP    Temp    Pulse 72 03/10/24 1047  Resp 21 03/10/24 1048  SpO2 90 % 03/10/24 1047  Vitals shown include unfiled device data.  Last Pain:  Vitals:   03/10/24 0902  TempSrc:   PainSc: 9          Complications: No notable events documented.

## 2024-03-10 NOTE — Anesthesia Preprocedure Evaluation (Addendum)
 Anesthesia Evaluation  Patient identified by MRN, date of birth, ID band Patient awake    Reviewed: Allergy & Precautions, H&P , NPO status , Patient's Chart, lab work & pertinent test results  Airway Mallampati: IV  TM Distance: >3 FB Neck ROM: full    Dental no notable dental hx.    Pulmonary sleep apnea , former smoker   Pulmonary exam normal        Cardiovascular Exercise Tolerance: Poor hypertension, (-) angina + DOE  (-) Orthopnea  + Systolic murmurs    Neuro/Psych negative neurological ROS  negative psych ROS   GI/Hepatic negative GI ROS, Neg liver ROS,,,  Endo/Other  diabetes, Poorly Controlled, Type 2, Insulin Dependent    Renal/GU Renal InsufficiencyRenal disease     Musculoskeletal   Abdominal  (+) + obese  Peds  Hematology negative hematology ROS (+)   Anesthesia Other Findings Left UPJ calculus with chills and coughing   ECHO 2023: NORMAL LEFT VENTRICULAR SYSTOLIC FUNCTION  NORMAL RIGHT VENTRICULAR SYSTOLIC FUNCTION  TRIVIAL REGURGITATION NOTED (See above)  NO VALVULAR STENOSIS    Past Medical History: No date: Actinic keratosis 05/07/2013: Basal cell carcinoma     Comment:  Right mid to distal dorsum nose.  01/15/2018: Basal cell carcinoma     Comment:  Left medial pectoral. Nodular, ulcerated. 04/21/2020: Basal cell carcinoma     Comment:  Left mid lat. pretibial. Nodular. EDC. No date: BPH (benign prostatic hyperplasia) No date: Chronic kidney disease No date: Chronic kidney disease No date: Diabetes mellitus without complication (HCC) No date: High blood triglycerides No date: Hyperlipidemia with low HDL No date: Hypertension No date: Obesity  Past Surgical History: No date: COLON SURGERY 02/01/2022: COLONOSCOPY; N/A     Comment:  Procedure: COLONOSCOPY;  Surgeon: Jaynie Collins,              DO;  Location: [facility] ENDOSCOPY;  Service:               Gastroenterology;   Laterality: N/A;  DM No date: COLONOSCOPY W/ POLYPECTOMY 04/12/2016: COLONOSCOPY WITH PROPOFOL; N/A     Comment:  Procedure: COLONOSCOPY WITH PROPOFOL;  Surgeon: Scot Jun, MD;  Location: [facility] ENDOSCOPY;  Service:               Endoscopy;  Laterality: N/A; No date: MM BCCCP - DIAG MAMMO W/CAD ([facility] HX); N/A     Comment:  left lip nose No date: TONSILLECTOMY  BMI    Body Mass Index: 38.52 kg/m      Reproductive/Obstetrics negative OB ROS                             Anesthesia Physical Anesthesia Plan  ASA: 3  Anesthesia Plan: General   Post-op Pain Management: Ofirmev IV (intra-op)*, Precedex and Tylenol PO (pre-op)*   Induction: Intravenous  PONV Risk Score and Plan: Dexamethasone and Ondansetron  Airway Management Planned: LMA  Additional Equipment:   Intra-op Plan:   Post-operative Plan: Extubation in OR  Informed Consent: I have reviewed the patients History and Physical, chart, labs and discussed the procedure including the risks, benefits and alternatives for the proposed anesthesia with the patient or authorized representative who has indicated his/her understanding and acceptance.     Dental Advisory Given  Plan Discussed with: Anesthesiologist, CRNA and Surgeon  Anesthesia Plan Comments:  Anesthesia Quick Evaluation

## 2024-03-10 NOTE — Progress Notes (Signed)
 1      PROGRESS NOTE    Stephen Mahoney  ZOX:[id number] DOB: [birth year] DOA: 03/09/2024 PCP: Marisue Ivan, MD   Brief Narrative:   78 y.o. male with medical history significant of obesity, type 2 diabetes, CKD, hypertension, history of kidney stones presenting with obstructive uropathy, lithiasis, acute on chronic kidney disease.   4/15: Urology consult, status post cystoscopy and left ureteral stent placement  Assessment & Plan:   Principal Problem:   Obstructive uropathy Active Problems:   Acute kidney injury superimposed on chronic kidney disease (HCC)   T2DM (type 2 diabetes mellitus) (HCC)   * Obstructive uropathy Nephrolithiasis  Noted L sided worsening flank pain with noted Mild left hydronephrosis upstream of a 1.9 cm ureteropelvic junction stone with perinephric stranding  Status post cystoscopy and left ureteral stent placement.  UTI Based on UA, continue IV Rocephin.  Await urine culture   Acute kidney injury superimposed on chronic kidney disease (HCC) Baseline Cr 1.7-2  Cr 2.6 on presentation -> 2.99 Likely secondary to obstructive uropathy  Continue IVF  Monitor  Hold nephrotoxic agents     T2DM (type 2 diabetes mellitus) (HCC) Continue sliding scale   Constipation Start bowel regimen   DVT prophylaxis: (Lovenox      Code Status: (Full code Family Communication: (Wife at bedside Disposition Plan: Possible discharge in next 2 to 3 days depending on clinical condition   Consultants:  Urology  Procedures:  Cystoscopy and ureteral stent placement  Antimicrobials: Rocephin   Subjective:  Feeling constipated.  Flank pain is somewhat improving.  Wife at bedside  Objective: Vitals:   03/10/24 1048 03/10/24 1100 03/10/24 1115 03/10/24 1236  BP: 97/60  (!) 118/49 (!) 100/37  Pulse:  65 62 (!) 58  Resp:  15 14 18   Temp:   98.8 F (37.1 C) 97.8 F (36.6 C)  TempSrc:      SpO2:  91% 91% 91%  Weight:      Height:         Intake/Output Summary (Last 24 hours) at 03/10/2024 1337 Last data filed at 03/10/2024 1016 Gross per 24 hour  Intake 400 ml  Output 100 ml  Net 300 ml   Filed Weights   03/09/24 1028 03/10/24 0811  Weight: 136.1 kg 136.1 kg    Examination:  General exam: Appears calm and comfortable  Respiratory system: Clear to auscultation. Respiratory effort normal. Cardiovascular system: S1 & S2 heard, RRR. No murmurs Gastrointestinal system: Abdomen is obese, soft, benign Central nervous system: Alert and oriented. No focal neurological deficits. Extremities: Symmetric 5 x 5 power. Skin: No rashes, lesions or ulcers Psychiatry: Judgement and insight appear normal. Mood & affect appropriate.     Data Reviewed: I have personally reviewed following labs and imaging studies  CBC: Recent Labs  Lab 03/09/24 1030 03/10/24 0606  WBC 18.8* 17.6*  HGB 16.4 14.4  HCT 48.5 42.9  MCV 91.9 93.3  PLT 279 219   Basic Metabolic Panel: Recent Labs  Lab 03/09/24 1030 03/10/24 0606  NA 139 136  K 3.6 3.9  CL 107 106  CO2 21* 22  GLUCOSE 205* 185*  BUN 56* 57*  CREATININE 2.59* 2.99*  CALCIUM 9.8 8.9   GFR: Estimated Creatinine Clearance: 31.3 mL/min (A) (by C-G formula based on SCr of 2.99 mg/dL (H)). Liver Function Tests: Recent Labs  Lab 03/10/24 0606  AST 15  ALT 16  ALKPHOS 33*  BILITOT 0.9  PROT 6.7  ALBUMIN 3.1*  HbA1C: Recent Labs    03/09/24 1030  HGBA1C 8.1*   CBG: Recent Labs  Lab 03/09/24 2015 03/10/24 0814 03/10/24 1048 03/10/24 1204  GLUCAP 223* 198* 175* 186*   Lipid Profile: No results for input(s): "CHOL", "HDL", "LDLCALC", "TRIG", "CHOLHDL", "LDLDIRECT" in the last 72 hours. Thyroid Function Tests: No results for input(s): "TSH", "T4TOTAL", "FREET4", "T3FREE", "THYROIDAB" in the last 72 hours. Anemia Panel: No results for input(s): "VITAMINB12", "FOLATE", "FERRITIN", "TIBC", "IRON", "RETICCTPCT" in the last 72 hours. Sepsis Labs: Recent  Labs  Lab 03/09/24 1525 03/09/24 1707  LATICACIDVEN 1.5 1.7    Recent Results (from the past 240 hours)  Blood culture (routine x 2)     Status: None (Preliminary result)   Collection Time: 03/09/24  3:25 PM   Specimen: BLOOD  Result Value Ref Range Status   Specimen Description   Final    BLOOD LEFT ANTECUBITAL Performed at [facility], [address], [place], [place] [address]    Special Requests   Final    BOTTLES DRAWN AEROBIC AND ANAEROBIC Blood Culture results may not be optimal due to an inadequate volume of blood received in culture bottles Performed at [facility], [address], [place], [place] [address]    Culture  Setup Time   Final    GRAM NEGATIVE RODS IN BOTH AEROBIC AND ANAEROBIC BOTTLES CRITICAL RESULT CALLED TO, READ BACK BY AND VERIFIED WITH: Annamaria Boots 03/10/24 8295 MW Performed at [facility], [address], [place], [place] [address]    Culture GRAM NEGATIVE RODS  Final   Report Status PENDING  Incomplete  Blood culture (routine x 2)     Status: None (Preliminary result)   Collection Time: 03/09/24  3:25 PM   Specimen: BLOOD  Result Value Ref Range Status   Specimen Description   Final    BLOOD RIGHT ANTECUBITAL Performed at [facility], [address] [place], [place] [address]    Special Requests   Final    BOTTLES DRAWN AEROBIC AND ANAEROBIC Blood Culture results may not be optimal due to an inadequate volume of blood received in culture bottles Performed at [facility], [address] [place], [place] [address]    Culture  Setup Time   Final    GRAM NEGATIVE RODS IN BOTH AEROBIC AND ANAEROBIC BOTTLES RBV NATHAN BLEU 03/10/24 9528 MW GRAM STAIN REVIEWED-AGREE WITH RESULT DRT Performed at [facility], [address], [place], [place] [address]    Culture GRAM NEGATIVE RODS  Final   Report Status PENDING  Incomplete  Blood Culture ID Panel (Reflexed)     Status: Abnormal    Collection Time: 03/09/24  3:25 PM  Result Value Ref Range Status   Enterococcus faecalis NOT DETECTED NOT DETECTED Final   Enterococcus Faecium NOT DETECTED NOT DETECTED Final   Listeria monocytogenes NOT DETECTED NOT DETECTED Final   Staphylococcus species NOT DETECTED NOT DETECTED Final   Staphylococcus aureus (BCID) NOT DETECTED NOT DETECTED Final   Staphylococcus epidermidis NOT DETECTED NOT DETECTED Final   Staphylococcus lugdunensis NOT DETECTED NOT DETECTED Final   Streptococcus species NOT DETECTED NOT DETECTED Final   Streptococcus agalactiae NOT DETECTED NOT DETECTED Final   Streptococcus pneumoniae NOT DETECTED NOT DETECTED Final   Streptococcus pyogenes NOT DETECTED NOT DETECTED Final   A.calcoaceticus-baumannii NOT DETECTED NOT DETECTED Final   Bacteroides fragilis NOT DETECTED NOT DETECTED Final   Enterobacterales DETECTED (A) NOT DETECTED Final  Comment: Enterobacterales represent a large order of gram negative bacteria, not a single organism. CRITICAL RESULT CALLED TO, READ BACK BY AND VERIFIED WITH: NATHAN BLEU 03/10/24 0614 MW    Enterobacter cloacae complex NOT DETECTED NOT DETECTED Final   Escherichia coli NOT DETECTED NOT DETECTED Final   Klebsiella aerogenes NOT DETECTED NOT DETECTED Final   Klebsiella oxytoca NOT DETECTED NOT DETECTED Final   Klebsiella pneumoniae NOT DETECTED NOT DETECTED Final   Proteus species DETECTED (A) NOT DETECTED Final    Comment: CRITICAL RESULT CALLED TO, READ BACK BY AND VERIFIED WITH: NATHAN BLEU 03/10/24 0614 MW    Salmonella species NOT DETECTED NOT DETECTED Final   Serratia marcescens NOT DETECTED NOT DETECTED Final   Haemophilus influenzae NOT DETECTED NOT DETECTED Final   Neisseria meningitidis NOT DETECTED NOT DETECTED Final   Pseudomonas aeruginosa NOT DETECTED NOT DETECTED Final   Stenotrophomonas maltophilia NOT DETECTED NOT DETECTED Final   Candida albicans NOT DETECTED NOT DETECTED Final   Candida auris NOT DETECTED  NOT DETECTED Final   Candida glabrata NOT DETECTED NOT DETECTED Final   Candida krusei NOT DETECTED NOT DETECTED Final   Candida parapsilosis NOT DETECTED NOT DETECTED Final   Candida tropicalis NOT DETECTED NOT DETECTED Final   Cryptococcus neoformans/gattii NOT DETECTED NOT DETECTED Final   CTX-M ESBL NOT DETECTED NOT DETECTED Final   Carbapenem resistance IMP NOT DETECTED NOT DETECTED Final   Carbapenem resistance KPC NOT DETECTED NOT DETECTED Final   Carbapenem resistance NDM NOT DETECTED NOT DETECTED Final   Carbapenem resist OXA 48 LIKE NOT DETECTED NOT DETECTED Final   Carbapenem resistance VIM NOT DETECTED NOT DETECTED Final    Comment: Performed at [facility], [address] [place], [place] [address]         Radiology Studies: DG OR UROLOGY CYSTO IMAGE [facility] ONLY) Result Date: 03/10/2024 There is no interpretation for this exam.  This order is for images obtained during a surgical procedure.  Please See "Surgeries" Tab for more information regarding the procedure.   CT ABDOMEN PELVIS WO CONTRAST Result Date: 03/09/2024 CLINICAL DATA:  History of lithotripsy with acute onset left flank pain EXAM: CT ABDOMEN AND PELVIS WITHOUT CONTRAST TECHNIQUE: Multidetector CT imaging of the abdomen and pelvis was performed following the standard protocol without IV contrast. RADIATION DOSE REDUCTION: This exam was performed according to the departmental dose-optimization program which includes automated exposure control, adjustment of the mA and/or kV according to patient size and/or use of iterative reconstruction technique. COMPARISON:  CT abdomen and pelvis dated 12/03/2006 FINDINGS: Lower chest: No focal consolidation or pulmonary nodule in the lung bases. No pleural effusion or pneumothorax demonstrated. Partially imaged heart size is normal. Coronary artery calcifications. Hepatobiliary: No focal hepatic lesions. No intra or extrahepatic biliary ductal dilation. Normal  gallbladder. Pancreas: No focal lesions or main ductal dilation. Spleen: Normal in size without focal abnormality. Adrenals/Urinary Tract: Diffuse thickening of the right adrenal gland without discrete nodule. No left adrenal nodule. No suspicious renal mass on this noncontrast enhanced examination. Mild left hydronephrosis upstream of a 1.9 cm your pelvic junction stone. Mild asymmetric left perinephric stranding and free fluid. Under distended urinary bladder with mild pericystic stranding. Stomach/Bowel: Normal appearance of the stomach. No evidence of bowel wall thickening, distention, or inflammatory changes. Colonic diverticulosis without acute diverticulitis. Normal appendix. Vascular/Lymphatic: Aortic atherosclerosis. No enlarged abdominal or pelvic lymph nodes. Reproductive: Prostate is unremarkable. Other: No free air or fluid collection. Musculoskeletal: No acute or abnormal lytic or  blastic osseous lesions. Multilevel degenerative changes of the partially imaged thoracic and lumbar spine. Small fat-containing bilateral inguinal hernias. IMPRESSION: 1. Mild left hydronephrosis upstream of a 1.9 cm ureteropelvic junction stone. Mild asymmetric left perinephric stranding and free fluid, which may be reactive or related to ascending infection. 2. Under distended urinary bladder with mild pericystic stranding, which may be related to underdistention or cystitis. Recommend correlation with urinalysis. 3. Colonic diverticulosis without acute diverticulitis. 4.  Aortic Atherosclerosis (ICD10-I70.0). Electronically Signed   By: Limin  Xu M.D.   On: 03/09/2024 13:36        Scheduled Meds:  enoxaparin (LOVENOX) injection  0.5 mg/kg Subcutaneous Q24H   insulin aspart  0-9 Units Subcutaneous TID WC   insulin glargine-yfgn  8 Units Subcutaneous Daily   polyethylene glycol  17 g Oral BID   senna-docusate  2 tablet Oral BID   tamsulosin  0.4 mg Oral Daily   Continuous Infusions:  sodium chloride 75 mL/hr  at 03/10/24 1230   cefTRIAXone (ROCEPHIN)  IV Stopped (03/10/24 0942)     LOS: 1 day    Time spent: 35 minutes    Colbin Jovel Mason Sole, MD [facility] Pager [phone]  If 7PM-7AM, please contact night-coverage [email or web address]  03/10/2024, 1:37 PM

## 2024-03-10 NOTE — Op Note (Signed)
    Preoperative diagnosis:  Left renal pelvic calculus Urinary tract infection  Postoperative diagnosis:  Left renal pelvic calculus Pyonephrosis  Procedure:  Cystoscopy Left ureteral stent placement (61F/30 cm) Left retrograde pyelography with interpretation  Surgeon: Geralyn Knee C. Trennon Torbeck, M.D.  Anesthesia: MAC  Complications: None  Intraoperative findings:  Cystoscopy: Urethra normal in caliber without stricture.  Prostate w/ moderate lateral lobe enlargement and bladder neck elevation.  Bladder mucosa without solid or papillary lesions.  Hyperemic mucosa with purulent material noted in bladder base.  Purulent efflux noted in the left UO Left retrograde pyelogram: Filling defect left renal pelvis consistent with known calculus.  Mild hydronephrosis.  EBL: Minimal  Specimens: None  Indication: Stephen Mahoney is a 78 y.o.male with a left renal pelvic calculus with pyuria, leukocytosis and subjective fever.  Refer to urology consult note for details.  After reviewing the management options for treatment, he elected to proceed with the above surgical procedure(s). We have discussed the potential benefits and risks of the procedure, side effects of the proposed treatment, the likelihood of the patient achieving the goals of the procedure, and any potential problems that might occur during the procedure or recuperation. Informed consent has been obtained.  Description of procedure:  The patient was taken to the operating room and sedation was obtained by anesthesia.  The patient was placed in the dorsal lithotomy position, prepped and draped in the usual sterile fashion, and preoperative antibiotics were administered. A preoperative time-out was performed.  A lidocaine Uro-Jet was instilled per urethra.  A 21 French scope was lubricated, placed per urethra and advanced into the bladder under direct vision with findings as described above.  Attention was directed to the left ureteral  orifice and a 0.038 Sensor guidewire was unable to be advanced beyond the mid ureter.  A 61F open ended ureteral catheter was then placed through the cystoscope and positioned at the left UO.  A 0.038  ZIPwire was placed through the ureteral catheter and advanced into the left UO and was easily advanced into the superior portion of the left kidney under fluoroscopic guidance.  The ureteral catheter was advanced over the wire to the superior portion of the kidney.  10 mL of purulent urine was aspirated and sent for culture.  Left retrograde pyelogram was then performed through the ureteral catheter with findings as described above.  The calculus was occupying the majority of the renal pelvis.  The Sensor wire was placed through the ureteral catheter into an upper pole calyx and the ureteral catheter was removed.    A 61F/30 cm Contour ureteral stent was advanced over the guidewire.  The stent was positioned appropriately under fluoroscopic and cystoscopic guidance.  The wire was then removed with an adequate stent curl noted in an upper pole calyx as well as in the bladder.  The bladder was then emptied and the procedure ended.  The patient appeared to tolerate the procedure well and without complications.  He was transported to the PACU in stable condition.  Plan: Continue IV antibiotics per the hospitalist service Will need definitive stone treatment 2-3 weeks   Darlynn Elam, MD

## 2024-03-10 NOTE — Progress Notes (Signed)
 [facility] PHYS[facility] CRITICAL VALUE ALERT - BLOOD CULTURE IDENTIFICATION (BCID)  Results for orders placed or performed during the hospital encounter of 03/09/24  Blood culture (routine x 2)     Status: None (Preliminary result)   Collection Time: 03/09/24  3:25 PM   Specimen: BLOOD  Result Value Ref Range Status   Specimen Description BLOOD LEFT ANTECUBITAL  Final   Special Requests   Final    BOTTLES DRAWN AEROBIC AND ANAEROBIC Blood Culture results may not be optimal due to an inadequate volume of blood received in culture bottles   Culture  Setup Time   Final    GRAM NEGATIVE RODS IN BOTH AEROBIC AND ANAEROBIC BOTTLES Organism ID to follow CRITICAL RESULT CALLED TO, READ BACK BY AND VERIFIED WITH: Stephen Mahoney 03/10/24 4540 MW Performed at [facility], [address] [place], [place] [address]    Culture PENDING  Incomplete   Report Status PENDING  Incomplete  Blood culture (routine x 2)     Status: None (Preliminary result)   Collection Time: 03/09/24  3:25 PM   Specimen: BLOOD  Result Value Ref Range Status   Specimen Description BLOOD RIGHT ANTECUBITAL  Final   Special Requests   Final    BOTTLES DRAWN AEROBIC AND ANAEROBIC Blood Culture results may not be optimal due to an inadequate volume of blood received in culture bottles   Culture  Setup Time   Final    GRAM NEGATIVE RODS ANAEROBIC BOTTLE ONLY RBV Stephen Mahoney 03/10/24 1478 MW Performed at [facility], [address] [place], [place] [address]    Culture PENDING  Incomplete   Report Status PENDING  Incomplete  Blood Culture ID Panel (Reflexed)     Status: Abnormal   Collection Time: 03/09/24  3:25 PM  Result Value Ref Range Status   Enterococcus faecalis NOT DETECTED NOT DETECTED Final   Enterococcus Faecium NOT DETECTED NOT DETECTED Final   Listeria monocytogenes NOT DETECTED NOT DETECTED Final   Staphylococcus species NOT DETECTED NOT DETECTED Final   Staphylococcus aureus  (BCID) NOT DETECTED NOT DETECTED Final   Staphylococcus epidermidis NOT DETECTED NOT DETECTED Final   Staphylococcus lugdunensis NOT DETECTED NOT DETECTED Final   Streptococcus species NOT DETECTED NOT DETECTED Final   Streptococcus agalactiae NOT DETECTED NOT DETECTED Final   Streptococcus pneumoniae NOT DETECTED NOT DETECTED Final   Streptococcus pyogenes NOT DETECTED NOT DETECTED Final   A.calcoaceticus-baumannii NOT DETECTED NOT DETECTED Final   Bacteroides fragilis NOT DETECTED NOT DETECTED Final   Enterobacterales DETECTED (A) NOT DETECTED Final    Comment: Enterobacterales represent a large order of gram negative bacteria, not a single organism. CRITICAL RESULT CALLED TO, READ BACK BY AND VERIFIED WITH: Stephen Mahoney 03/10/24 0614 MW    Enterobacter cloacae complex NOT DETECTED NOT DETECTED Final   Escherichia coli NOT DETECTED NOT DETECTED Final   Klebsiella aerogenes NOT DETECTED NOT DETECTED Final   Klebsiella oxytoca NOT DETECTED NOT DETECTED Final   Klebsiella pneumoniae NOT DETECTED NOT DETECTED Final   Proteus species DETECTED (A) NOT DETECTED Final    Comment: CRITICAL RESULT CALLED TO, READ BACK BY AND VERIFIED WITH: Stephen Mahoney 03/10/24 0614 MW    Salmonella species NOT DETECTED NOT DETECTED Final   Serratia marcescens NOT DETECTED NOT DETECTED Final   Haemophilus influenzae NOT DETECTED NOT DETECTED Final   Neisseria meningitidis NOT DETECTED NOT DETECTED Final   Pseudomonas aeruginosa NOT DETECTED NOT DETECTED Final   Stenotrophomonas maltophilia NOT DETECTED  NOT DETECTED Final   Candida albicans NOT DETECTED NOT DETECTED Final   Candida auris NOT DETECTED NOT DETECTED Final   Candida glabrata NOT DETECTED NOT DETECTED Final   Candida krusei NOT DETECTED NOT DETECTED Final   Candida parapsilosis NOT DETECTED NOT DETECTED Final   Candida tropicalis NOT DETECTED NOT DETECTED Final   Cryptococcus neoformans/gattii NOT DETECTED NOT DETECTED Final   CTX-M ESBL NOT  DETECTED NOT DETECTED Final   Carbapenem resistance IMP NOT DETECTED NOT DETECTED Final   Carbapenem resistance KPC NOT DETECTED NOT DETECTED Final   Carbapenem resistance NDM NOT DETECTED NOT DETECTED Final   Carbapenem resist OXA 48 LIKE NOT DETECTED NOT DETECTED Final   Carbapenem resistance VIM NOT DETECTED NOT DETECTED Final    Comment: Performed at [facility], [address] [place], [place] [address]    BCID Results: 3 of 4 bottles with Proteus species, no resistance detected.  Name of provider contacted: Stephen Pellegrini, MD  Changes to prescribed antibiotics required: No changes needed.  Stephen Mahoney, PharmD, [facility] 03/10/2024 6:24 AM

## 2024-03-10 NOTE — Interval H&P Note (Signed)
 History and Physical Interval Note:  03/10/2024 9:59 AM  Stephen Mahoney  has presented today for surgery, with the diagnosis of kidney stone.  The various methods of treatment have been discussed with the patient and family. After consideration of risks, benefits and other options for treatment, the patient has consented to  Procedure(s): CYSTOSCOPY, WITH STENT INSERTION (Left) as a surgical intervention.  The patient's history has been reviewed, patient examined, no change in status, stable for surgery.  I have reviewed the patient's chart and labs.  Questions were answered to the patient's satisfaction.     Gregery Walberg C Arisha Gervais

## 2024-03-11 ENCOUNTER — Other Ambulatory Visit: Payer: Self-pay | Admitting: Physician Assistant

## 2024-03-11 ENCOUNTER — Encounter: Payer: Self-pay | Admitting: Urology

## 2024-03-11 DIAGNOSIS — N178 Other acute kidney failure: Secondary | ICD-10-CM | POA: Diagnosis not present

## 2024-03-11 DIAGNOSIS — N200 Calculus of kidney: Secondary | ICD-10-CM

## 2024-03-11 DIAGNOSIS — N139 Obstructive and reflux uropathy, unspecified: Secondary | ICD-10-CM | POA: Diagnosis not present

## 2024-03-11 DIAGNOSIS — N10 Acute tubulo-interstitial nephritis: Secondary | ICD-10-CM

## 2024-03-11 DIAGNOSIS — R7881 Bacteremia: Secondary | ICD-10-CM | POA: Diagnosis not present

## 2024-03-11 DIAGNOSIS — N1832 Chronic kidney disease, stage 3b: Secondary | ICD-10-CM

## 2024-03-11 DIAGNOSIS — B964 Proteus (mirabilis) (morganii) as the cause of diseases classified elsewhere: Secondary | ICD-10-CM

## 2024-03-11 DIAGNOSIS — E872 Acidosis, unspecified: Secondary | ICD-10-CM | POA: Insufficient documentation

## 2024-03-11 DIAGNOSIS — N12 Tubulo-interstitial nephritis, not specified as acute or chronic: Secondary | ICD-10-CM | POA: Diagnosis not present

## 2024-03-11 LAB — CBC
HCT: 41.1 % (ref 39.0–52.0)
Hemoglobin: 13.4 g/dL (ref 13.0–17.0)
MCH: 30.7 pg (ref 26.0–34.0)
MCHC: 32.6 g/dL (ref 30.0–36.0)
MCV: 94.3 fL (ref 80.0–100.0)
Platelets: 196 10*3/uL (ref 150–400)
RBC: 4.36 MIL/uL (ref 4.22–5.81)
RDW: 13.2 % (ref 11.5–15.5)
WBC: 14.6 10*3/uL — ABNORMAL HIGH (ref 4.0–10.5)
nRBC: 0 % (ref 0.0–0.2)

## 2024-03-11 LAB — GLUCOSE, CAPILLARY
Glucose-Capillary: 111 mg/dL — ABNORMAL HIGH (ref 70–99)
Glucose-Capillary: 150 mg/dL — ABNORMAL HIGH (ref 70–99)
Glucose-Capillary: 99 mg/dL (ref 70–99)
Glucose-Capillary: 104 mg/dL — ABNORMAL HIGH (ref 70–99)

## 2024-03-11 LAB — BASIC METABOLIC PANEL WITH GFR
Anion gap: 9 (ref 5–15)
BUN: 59 mg/dL — ABNORMAL HIGH (ref 8–23)
CO2: 20 mmol/L — ABNORMAL LOW (ref 22–32)
Calcium: 8.6 mg/dL — ABNORMAL LOW (ref 8.9–10.3)
Chloride: 107 mmol/L (ref 98–111)
Creatinine, Ser: 2.83 mg/dL — ABNORMAL HIGH (ref 0.61–1.24)
GFR, Estimated: 23 mL/min — ABNORMAL LOW (ref >60)
Glucose, Bld: 94 mg/dL (ref 70–99)
Potassium: 3.6 mmol/L (ref 3.5–5.1)
Sodium: 136 mmol/L (ref 135–145)

## 2024-03-11 MED ORDER — LACTULOSE 10 GM/15ML PO SOLN
20.0000 g | Freq: Once | ORAL | Status: AC
  Administered 2024-03-11: 20 g via ORAL
  Filled 2024-03-11: qty 30

## 2024-03-11 MED ORDER — SODIUM BICARBONATE 650 MG PO TABS
650.0000 mg | ORAL_TABLET | Freq: Two times a day (BID) | ORAL | Status: DC
  Administered 2024-03-11 – 2024-03-12 (×3): 650 mg via ORAL
  Filled 2024-03-11 (×3): qty 1

## 2024-03-11 NOTE — Hospital Course (Signed)
 78 y.o. male with medical history significant of obesity, type 2 diabetes, CKD, hypertension, history of kidney stones presenting with obstructive uropathy, lithiasis, acute on chronic kidney disease.  He had a left ureteral stent placed on 4/15, and urine cultures are all positive for Proteus Mirabilis.  Patient is treated with high-dose Rocephin. Blood culture and urine culture finalized.  Susceptibility, patient will be treated with amoxicillin 1 g 3 times a day for total course of 14 days antibiotics.

## 2024-03-11 NOTE — Progress Notes (Signed)
  Progress Note   Patient: Stephen Mahoney VHQ:[id number] DOB: [birth year] DOA: 03/09/2024     2 DOS: the patient was seen and examined on 03/11/2024   Brief hospital course: 78 y.o. male with medical history significant of obesity, type 2 diabetes, CKD, hypertension, history of kidney stones presenting with obstructive uropathy, lithiasis, acute on chronic kidney disease.  He had a left ureteral stent placed on 4/15, and urine cultures are all positive for Proteus Mirabilis.  Patient is treated with high-dose Rocephin.   Principal Problem:   Obstructive uropathy Active Problems:   Acute renal failure superimposed on stage 3b chronic kidney disease (HCC)   T2DM (type 2 diabetes mellitus) (HCC)   Acute pyelonephritis   Nephrolithiasis   Bacteremia due to Proteus species   Metabolic acidosis   Assessment and Plan: * Obstructive uropathy Nephrolithiasis  Acute pyelonephritis secondary to obstruction. Proteus bacteremia. Patient is status post ureteral stents.  Positive urine culture and blood culture positive for Proteus.  Continue Rocephin.  Repeat blood cultures. Patient did not meet sepsis criteria at time of admission.   Acute kidney injury superimposed on chronic kidney disease 3B. Mild metabolic acidosis. Baseline Cr 1.7-2  Cr 2.6 on presentation -> 2.99 Likely secondary to obstructive uropathy  Continue normal saline for today.  Added sodium bicarb orally.     T2DM (type 2 diabetes mellitus) (HCC) Continue sliding scale   Constipation Still had a normal bowel movement after senna.  Give a dose of lactulose.  Class II obesity with comorbidities.  BMI 38.52. Obstructive sleep apnea Patient was prescribed CPAP in the past, but could not tolerate it He was placed on 2 L oxygen last night.  Continue to follow.     Subjective:  Patient feels well, no significant flank pain.  No fever or chills.  Physical Exam: Vitals:   03/10/24 1819 03/10/24 2024 03/11/24 0435  03/11/24 0829  BP:  (!) 125/57 (!) 110/58 124/72  Pulse:  (!) 52 (!) 47 (!) 46  Resp:  17 18 19   Temp:  98.5 F (36.9 C) 98.9 F (37.2 C) 98.7 F (37.1 C)  TempSrc:  Oral Oral Oral  SpO2: 95% 91% 93% 94%  Weight:      Height:       General exam: Appears calm and comfortable  Respiratory system: Clear to auscultation. Respiratory effort normal. Cardiovascular system: S1 & S2 heard, RRR. No JVD, murmurs, rubs, gallops or clicks. No pedal edema. Gastrointestinal system: Abdomen is nondistended, soft and nontender. No organomegaly or masses felt. Normal bowel sounds heard. Central nervous system: Alert and oriented. No focal neurological deficits. Extremities: Symmetric 5 x 5 power. Skin: No rashes, lesions or ulcers Psychiatry: Judgement and insight appear normal. Mood & affect appropriate.    Data Reviewed:  CT scan results and lab results reviewed.  Family Communication: None  Disposition: Status is: Inpatient Remains inpatient appropriate because: Severity of disease, IV treatment.     Time spent: 35 minutes  Author: Donaciano Frizzle, MD 03/11/2024 12:34 PM  For on call review [email or web address].

## 2024-03-11 NOTE — Progress Notes (Signed)
 Urology Inpatient Progress Note  Subjective: No acute events overnight. He is afebrile, stably bradycardic. WBC count down, 14.6. Creatinine down, 2.83. Urine culture pending, blood cultures growing Proteus; on antibiotics as below. He is spontaneously voiding yellow urine. Today he reports some persistent LLQ soreness.  Anti-infectives: Anti-infectives (From admission, onward)    Start     Dose/Rate Route Frequency Ordered Stop   03/10/24 1000  cefTRIAXone (ROCEPHIN) 2 g in sodium chloride 0.9 % 100 mL IVPB  Status:  Discontinued        2 g 200 mL/hr over 30 Minutes Intravenous Every 24 hours 03/09/24 1733 03/10/24 0628   03/10/24 1000  cefTRIAXone (ROCEPHIN) 2 g in sodium chloride 0.9 % 100 mL IVPB        2 g 200 mL/hr over 30 Minutes Intravenous Every 24 hours 03/10/24 0628     03/09/24 1445  cefTRIAXone (ROCEPHIN) 2 g in sodium chloride 0.9 % 100 mL IVPB        2 g 200 mL/hr over 30 Minutes Intravenous Once 03/09/24 1444 03/09/24 1603       Current Facility-Administered Medications  Medication Dose Route Frequency Provider Last Rate Last Admin   0.9 %  sodium chloride infusion   Intravenous Continuous Donaciano Frizzle, MD 75 mL/hr at 03/10/24 1230 New Bag at 03/10/24 1230   acetaminophen (TYLENOL) tablet 650 mg  650 mg Oral Q6H PRN Stoioff, Scott C, MD   650 mg at 03/10/24 1235   cefTRIAXone (ROCEPHIN) 2 g in sodium chloride 0.9 % 100 mL IVPB  2 g Intravenous Q24H Stoioff, Scott C, MD   Stopped at 03/10/24 0942   enoxaparin (LOVENOX) injection 67.5 mg  0.5 mg/kg Subcutaneous Q24H Stoioff, Scott C, MD   67.5 mg at 03/10/24 2054   HYDROmorphone (DILAUDID) injection 1 mg  1 mg Intravenous Q4H PRN Stoioff, Scott C, MD   1 mg at 03/11/24 0402   insulin aspart (novoLOG) injection 0-9 Units  0-9 Units Subcutaneous TID WC Stoioff, Scott C, MD   2 Units at 03/10/24 1234   insulin glargine-yfgn (SEMGLEE) injection 8 Units  8 Units Subcutaneous Daily Stoioff, Scott C, MD   8 Units at 03/10/24  2055   ondansetron (ZOFRAN) tablet 4 mg  4 mg Oral Q6H PRN Stoioff, Scott C, MD       Or   ondansetron (ZOFRAN) injection 4 mg  4 mg Intravenous Q6H PRN Stoioff, Scott C, MD   4 mg at 03/11/24 0402   oxybutynin (DITROPAN) tablet 5 mg  5 mg Oral Q8H PRN Stoioff, Scott C, MD       polyethylene glycol (MIRALAX / GLYCOLAX) packet 17 g  17 g Oral BID Brenna Cam, MD   17 g at 03/10/24 2053   senna-docusate (Senokot-S) tablet 2 tablet  2 tablet Oral BID Brenna Cam, MD   2 tablet at 03/10/24 2053   tamsulosin (FLOMAX) capsule 0.4 mg  0.4 mg Oral Daily Stoioff, Scott C, MD   0.4 mg at 03/10/24 1235    Objective: Vital signs in last 24 hours: Temp:  [97.8 F (36.6 C)-98.9 F (37.2 C)] 98.7 F (37.1 C) (04/16 0829) Pulse Rate:  [46-72] 46 (04/16 0829) Resp:  [14-19] 19 (04/16 0829) BP: (97-125)/(37-72) 124/72 (04/16 0829) SpO2:  [83 %-95 %] 94 % (04/16 0829)  Intake/Output from previous day: 04/15 0701 - 04/16 0700 In: 791.5 [I.V.:691.5; IV Piggyback:100] Out: 500 [Urine:500] Intake/Output this shift: No intake/output data recorded.  Physical Exam Vitals and nursing  note reviewed.  Constitutional:      General: He is not in acute distress.    Appearance: He is not ill-appearing, toxic-appearing or diaphoretic.  HENT:     Head: Normocephalic and atraumatic.  Pulmonary:     Effort: Pulmonary effort is normal. No respiratory distress.  Skin:    General: Skin is warm and dry.  Neurological:     Mental Status: He is alert and oriented to person, place, and time.  Psychiatric:        Mood and Affect: Mood normal.        Behavior: Behavior normal.    Lab Results:  Recent Labs    03/10/24 0606 03/11/24 0157  WBC 17.6* 14.6*  HGB 14.4 13.4  HCT 42.9 41.1  PLT 219 196   BMET Recent Labs    03/10/24 0606 03/11/24 0157  NA 136 136  K 3.9 3.6  CL 106 107  CO2 22 20*  GLUCOSE 185* 94  BUN 57* 59*  CREATININE 2.99* 2.83*  CALCIUM 8.9 8.6*   Studies/Results: DG OR  UROLOGY CYSTO IMAGE ([facility] ONLY) Result Date: 03/10/2024 There is no interpretation for this exam.  This order is for images obtained during a surgical procedure.  Please See "Surgeries" Tab for more information regarding the procedure.   CT ABDOMEN PELVIS WO CONTRAST Result Date: 03/09/2024 CLINICAL DATA:  History of lithotripsy with acute onset left flank pain EXAM: CT ABDOMEN AND PELVIS WITHOUT CONTRAST TECHNIQUE: Multidetector CT imaging of the abdomen and pelvis was performed following the standard protocol without IV contrast. RADIATION DOSE REDUCTION: This exam was performed according to the departmental dose-optimization program which includes automated exposure control, adjustment of the mA and/or kV according to patient size and/or use of iterative reconstruction technique. COMPARISON:  CT abdomen and pelvis dated 12/03/2006 FINDINGS: Lower chest: No focal consolidation or pulmonary nodule in the lung bases. No pleural effusion or pneumothorax demonstrated. Partially imaged heart size is normal. Coronary artery calcifications. Hepatobiliary: No focal hepatic lesions. No intra or extrahepatic biliary ductal dilation. Normal gallbladder. Pancreas: No focal lesions or main ductal dilation. Spleen: Normal in size without focal abnormality. Adrenals/Urinary Tract: Diffuse thickening of the right adrenal gland without discrete nodule. No left adrenal nodule. No suspicious renal mass on this noncontrast enhanced examination. Mild left hydronephrosis upstream of a 1.9 cm your pelvic junction stone. Mild asymmetric left perinephric stranding and free fluid. Under distended urinary bladder with mild pericystic stranding. Stomach/Bowel: Normal appearance of the stomach. No evidence of bowel wall thickening, distention, or inflammatory changes. Colonic diverticulosis without acute diverticulitis. Normal appendix. Vascular/Lymphatic: Aortic atherosclerosis. No enlarged abdominal or pelvic lymph nodes. Reproductive:  Prostate is unremarkable. Other: No free air or fluid collection. Musculoskeletal: No acute or abnormal lytic or blastic osseous lesions. Multilevel degenerative changes of the partially imaged thoracic and lumbar spine. Small fat-containing bilateral inguinal hernias. IMPRESSION: 1. Mild left hydronephrosis upstream of a 1.9 cm ureteropelvic junction stone. Mild asymmetric left perinephric stranding and free fluid, which may be reactive or related to ascending infection. 2. Under distended urinary bladder with mild pericystic stranding, which may be related to underdistention or cystitis. Recommend correlation with urinalysis. 3. Colonic diverticulosis without acute diverticulitis. 4.  Aortic Atherosclerosis (ICD10-I70.0). Electronically Signed   By: Limin  Xu M.D.   On: 03/09/2024 13:36   Assessment & Plan: 78 y.o. male with PMH recurrent stone disease, diabetes, and CKD now s/p left ureteral stent placement with Dr. Cherylene Corrente for management of a left renal pelvic  calculus and pyonephrosis.  He is clinically improving on empiric antibiotics.  He is tolerating his stent well with some persistent LLQ soreness.  We discussed that he will require 10-14 days of culture-appropriate antibiotics to treat his infection, followed by outpatient left ureteroscopy with laser lithotripsy and stent exchange in 2-3 weeks to treat his stone. He expressed understanding.  Recommendations: -Continue empiric antibiotics and follow cultures for a total of 10-14 days of culture-appropriate therapy. - Continue Flomax 0.4mg  daily and oxybutynin 5mg  every 8 hours as needed for stent discomfort. -Outpatient left URS/LL/stent exchange with Dr. Cherylene Corrente in 2-3 weeks; our scheduler will contact him after discharge to arrange  Kathreen Pare, PA-C 03/11/2024

## 2024-03-11 NOTE — Discharge Instructions (Signed)
 Ureteral stents may cause flank pain, bladder pain, painful urination, urinary urgency/frequency/leakage, and blood in the urine. To help manage those symptoms, please keep taking your Flomax (tamsulosin) daily and Ditropan (oxybutynin) every 8 hours as needed for pain and spasms.

## 2024-03-12 ENCOUNTER — Other Ambulatory Visit: Payer: Self-pay

## 2024-03-12 DIAGNOSIS — N10 Acute tubulo-interstitial nephritis: Secondary | ICD-10-CM | POA: Diagnosis not present

## 2024-03-12 DIAGNOSIS — N178 Other acute kidney failure: Secondary | ICD-10-CM | POA: Diagnosis not present

## 2024-03-12 DIAGNOSIS — N139 Obstructive and reflux uropathy, unspecified: Secondary | ICD-10-CM | POA: Diagnosis not present

## 2024-03-12 DIAGNOSIS — N1832 Chronic kidney disease, stage 3b: Secondary | ICD-10-CM | POA: Diagnosis not present

## 2024-03-12 LAB — BASIC METABOLIC PANEL WITH GFR
Anion gap: 9 (ref 5–15)
BUN: 55 mg/dL — ABNORMAL HIGH (ref 8–23)
CO2: 21 mmol/L — ABNORMAL LOW (ref 22–32)
Calcium: 8.5 mg/dL — ABNORMAL LOW (ref 8.9–10.3)
Chloride: 107 mmol/L (ref 98–111)
Creatinine, Ser: 2.4 mg/dL — ABNORMAL HIGH (ref 0.61–1.24)
GFR, Estimated: 27 mL/min — ABNORMAL LOW (ref >60)
Glucose, Bld: 117 mg/dL — ABNORMAL HIGH (ref 70–99)
Potassium: 3.5 mmol/L (ref 3.5–5.1)
Sodium: 137 mmol/L (ref 135–145)

## 2024-03-12 LAB — CULTURE, BLOOD (ROUTINE X 2)

## 2024-03-12 LAB — URINE CULTURE: Culture: 4000 — AB

## 2024-03-12 LAB — MAGNESIUM: Magnesium: 2.3 mg/dL (ref 1.7–2.4)

## 2024-03-12 LAB — GLUCOSE, CAPILLARY: Glucose-Capillary: 117 mg/dL — ABNORMAL HIGH (ref 70–99)

## 2024-03-12 MED ORDER — OXYBUTYNIN CHLORIDE 5 MG PO TABS
5.0000 mg | ORAL_TABLET | Freq: Three times a day (TID) | ORAL | 0 refills | Status: AC | PRN
  Filled 2024-03-12: qty 20, 7d supply, fill #0

## 2024-03-12 MED ORDER — LANTUS SOLOSTAR 100 UNIT/ML ~~LOC~~ SOPN: 8.0000 [IU] | PEN_INJECTOR | Freq: Every day | SUBCUTANEOUS | Status: AC

## 2024-03-12 MED ORDER — SODIUM BICARBONATE 650 MG PO TABS
650.0000 mg | ORAL_TABLET | Freq: Two times a day (BID) | ORAL | 0 refills | Status: AC
  Filled 2024-03-12: qty 14, 7d supply, fill #0

## 2024-03-12 MED ORDER — AMOXICILLIN 500 MG PO CAPS
1000.0000 mg | ORAL_CAPSULE | Freq: Three times a day (TID) | ORAL | 0 refills | Status: AC
  Filled 2024-03-12: qty 72, 12d supply, fill #0

## 2024-03-12 MED ORDER — AMOXICILLIN 500 MG PO CAPS
1000.0000 mg | ORAL_CAPSULE | Freq: Three times a day (TID) | ORAL | Status: DC
  Administered 2024-03-12: 1000 mg via ORAL
  Filled 2024-03-12: qty 2

## 2024-03-12 MED ORDER — INSULIN LISPRO 100 UNIT/ML IJ SOLN: 4.0000 [IU] | Freq: Three times a day (TID) | INTRAMUSCULAR | Status: AC

## 2024-03-12 MED ORDER — POTASSIUM CHLORIDE CRYS ER 20 MEQ PO TBCR
20.0000 meq | EXTENDED_RELEASE_TABLET | Freq: Once | ORAL | Status: AC
  Administered 2024-03-12: 20 meq via ORAL
  Filled 2024-03-12: qty 1

## 2024-03-12 NOTE — Care Management Important Message (Signed)
 Important Message  Patient Details  Name: Stephen Mahoney MRN: [id number] Date of Birth: 26-May-1946   Important Message Given:  Yes - Medicare IM     Dallis Czaja W, CMA 03/12/2024, 10:18 AM

## 2024-03-12 NOTE — Progress Notes (Signed)
 Patient will discharge to lounge and meds to beds will be delivered to discharge lounge per communication with Parthenia Bolt, [facility]. Charge RN Brandi and [employer] notified and aware.

## 2024-03-12 NOTE — Discharge Summary (Signed)
 Physician Discharge Summary   Patient: Stephen Mahoney MRN: [id number] DOB: September 17, 1946  Admit date:     03/09/2024  Discharge date: 03/12/24  Discharge Physician: Donaciano Frizzle   PCP: Monique Ano, MD   Recommendations at discharge:   Follow-up with PCP in 1 week. Follow-up with urology in 2 weeks.  Discharge Diagnoses: Principal Problem:   Obstructive uropathy Active Problems:   Acute renal failure superimposed on stage 3b chronic kidney disease (HCC)   T2DM (type 2 diabetes mellitus) (HCC)   Acute pyelonephritis   Nephrolithiasis   Bacteremia due to Proteus species   Metabolic acidosis  Resolved Problems:   * No resolved hospital problems. *  Hospital Course: 78 y.o. male with medical history significant of obesity, type 2 diabetes, CKD, hypertension, history of kidney stones presenting with obstructive uropathy, lithiasis, acute on chronic kidney disease.  He had a left ureteral stent placed on 4/15, and urine cultures are all positive for Proteus Mirabilis.  Patient is treated with high-dose Rocephin. Blood culture and urine culture finalized.  Susceptibility, patient will be treated with amoxicillin 1 g 3 times a day for total course of 14 days antibiotics.  Assessment and Plan:  Obstructive uropathy Nephrolithiasis  Acute pyelonephritis secondary to obstruction. Proteus bacteremia. Patient is status post ureteral stents.  Positive urine culture and blood culture positive for Proteus.  Treated with Rocephin.  Repeat blood cultures so far negative. Patient did not meet sepsis criteria at time of admission. Patient condition has improved, transferred to go home today instead of tomorrow, patient to be treated with amoxicillin with total antibiotic course of 14 days.  Patient be followed by urology in 2 weeks.   Acute kidney injury superimposed on chronic kidney disease 3B. Mild metabolic acidosis. Baseline Cr 1.7-2  Cr 2.6 on presentation -> 2.99 Likely secondary to  obstructive uropathy  Renal function gradually improving after IV fluids.  At this point, medically stable for discharge.     T2DM (type 2 diabetes mellitus) (HCC) Continue home medicine with reduced insulin dose.  Follow-up with PCP as outpatient.   Constipation Condition has improved after giving lactulose and senna.   Class II obesity with comorbidities.  BMI 38.52. Obstructive sleep apnea Patient was prescribed CPAP in the past, but could not tolerate it I have confirmed with patient that he was not on oxygen last night, he sleeps on his stomach. Do not believe OSA is a problem any more. Follow with PCP.        Consultants: Urology Procedures performed: Ureteral stents. Disposition: Home Diet recommendation:  Discharge Diet Orders (From admission, onward)     Start     Ordered   03/12/24 0000  Diet - low sodium heart healthy        03/12/24 1034           Cardiac diet DISCHARGE MEDICATION: Allergies as of 03/12/2024       Reactions   Sulfa Antibiotics Swelling, Rash        Medication List     STOP taking these medications    aspirin 81 MG tablet   doxycycline 20 MG tablet Commonly known as: PERIOSTAT   Dutasteride-Tamsulosin HCl 0.5-0.4 MG Caps   glimepiride 4 MG tablet Commonly known as: AMARYL   metFORMIN 500 MG tablet Commonly known as: GLUCOPHAGE   valsartan-hydrochlorothiazide 320-25 MG tablet Commonly known as: DIOVAN-HCT   VICTOZA [place]       TAKE these medications    amLODipine 5 MG tablet Commonly known  as: NORVASC Take 10 mg by mouth daily.   amoxicillin 500 MG capsule Commonly known as: AMOXIL Take 2 capsules (1,000 mg total) by mouth every 8 (eight) hours for 12 days.   dutasteride 0.5 MG capsule Commonly known as: AVODART Take 0.5 mg by mouth daily.   Farxiga 10 MG Tabs tablet Generic drug: dapagliflozin propanediol Take 10 mg by mouth daily.   glucose blood test strip 1 each by Other route as needed for other. Use  as instructed   insulin lispro 100 UNIT/ML injection Commonly known as: HUMALOG Inject 0.04 mLs (4 Units total) into the skin 3 (three) times daily with meals. What changed:  how much to take how to take this   ketoconazole 2 % cream Commonly known as: NIZORAL Apply to feet QHS. What changed:  how much to take how to take this when to take this reasons to take this   Lantus SoloStar 100 UNIT/ML Solostar Pen Generic drug: insulin glargine Inject 8 Units into the skin at bedtime. What changed: how much to take   lovastatin 20 MG tablet Commonly known as: MEVACOR Take 20 mg by mouth at bedtime.   oxybutynin 5 MG tablet Commonly known as: DITROPAN Take 1 tablet (5 mg total) by mouth every 8 (eight) hours as needed for bladder spasms.   sodium bicarbonate 650 MG tablet Take 1 tablet (650 mg total) by mouth 2 (two) times daily for 7 days.   tamsulosin 0.4 MG Caps capsule Commonly known as: FLOMAX Take 0.4 mg by mouth.   traZODone 100 MG tablet Commonly known as: DESYREL Take 100 mg by mouth at bedtime.   Trulicity 3 MG/0.5ML Soaj Generic drug: Dulaglutide Inject 3 mg into the skin.        Follow-up Information     Monique Ano, MD Follow up.   Specialty: Family Medicine Why: Hospital follow up Contact information: [address] [facility] [place] [address] [phone]         Monique Ano, MD Follow up.   Specialty: Family Medicine Contact information: [address] [facility] [place] [address] [phone]         Geraline Knapp, MD Follow up in 2 week(s).   Specialty: Urology Contact information: [address] [place] [address] [phone]                Discharge Exam: Cleavon Curls Weights   03/09/24 1028 03/10/24 0811  Weight: 136.1 kg 136.1 kg   General exam: Appears calm and comfortable  Respiratory system: Clear to auscultation. Respiratory effort  normal. Cardiovascular system: S1 & S2 heard, RRR. No JVD, murmurs, rubs, gallops or clicks. No pedal edema. Gastrointestinal system: Abdomen is nondistended, soft and nontender. No organomegaly or masses felt. Normal bowel sounds heard. Central nervous system: Alert and oriented. No focal neurological deficits. Extremities: Symmetric 5 x 5 power. Skin: No rashes, lesions or ulcers Psychiatry: Judgement and insight appear normal. Mood & affect appropriate.    Condition at discharge: good  The results of significant diagnostics from this hospitalization (including imaging, microbiology, ancillary and laboratory) are listed below for reference.   Imaging Studies: DG OR UROLOGY CYSTO IMAGE ([facility] ONLY) Result Date: 03/10/2024 There is no interpretation for this exam.  This order is for images obtained during a surgical procedure.  Please See "Surgeries" Tab for more information regarding the procedure.   CT ABDOMEN PELVIS WO CONTRAST Result Date: 03/09/2024 CLINICAL DATA:  History of lithotripsy  with acute onset left flank pain EXAM: CT ABDOMEN AND PELVIS WITHOUT CONTRAST TECHNIQUE: Multidetector CT imaging of the abdomen and pelvis was performed following the standard protocol without IV contrast. RADIATION DOSE REDUCTION: This exam was performed according to the departmental dose-optimization program which includes automated exposure control, adjustment of the mA and/or kV according to patient size and/or use of iterative reconstruction technique. COMPARISON:  CT abdomen and pelvis dated 12/03/2006 FINDINGS: Lower chest: No focal consolidation or pulmonary nodule in the lung bases. No pleural effusion or pneumothorax demonstrated. Partially imaged heart size is normal. Coronary artery calcifications. Hepatobiliary: No focal hepatic lesions. No intra or extrahepatic biliary ductal dilation. Normal gallbladder. Pancreas: No focal lesions or main ductal dilation. Spleen: Normal in size without focal  abnormality. Adrenals/Urinary Tract: Diffuse thickening of the right adrenal gland without discrete nodule. No left adrenal nodule. No suspicious renal mass on this noncontrast enhanced examination. Mild left hydronephrosis upstream of a 1.9 cm your pelvic junction stone. Mild asymmetric left perinephric stranding and free fluid. Under distended urinary bladder with mild pericystic stranding. Stomach/Bowel: Normal appearance of the stomach. No evidence of bowel wall thickening, distention, or inflammatory changes. Colonic diverticulosis without acute diverticulitis. Normal appendix. Vascular/Lymphatic: Aortic atherosclerosis. No enlarged abdominal or pelvic lymph nodes. Reproductive: Prostate is unremarkable. Other: No free air or fluid collection. Musculoskeletal: No acute or abnormal lytic or blastic osseous lesions. Multilevel degenerative changes of the partially imaged thoracic and lumbar spine. Small fat-containing bilateral inguinal hernias. IMPRESSION: 1. Mild left hydronephrosis upstream of a 1.9 cm ureteropelvic junction stone. Mild asymmetric left perinephric stranding and free fluid, which may be reactive or related to ascending infection. 2. Under distended urinary bladder with mild pericystic stranding, which may be related to underdistention or cystitis. Recommend correlation with urinalysis. 3. Colonic diverticulosis without acute diverticulitis. 4.  Aortic Atherosclerosis (ICD10-I70.0). Electronically Signed   By: Agustin Cree M.D.   On: 03/09/2024 13:36    Microbiology: Results for orders placed or performed during the hospital encounter of 03/09/24  Blood culture (routine x 2)     Status: Abnormal   Collection Time: 03/09/24  3:25 PM   Specimen: BLOOD  Result Value Ref Range Status   Specimen Description   Final    BLOOD LEFT ANTECUBITAL Performed at [facility], [address] [place], [place] [address]    Special Requests   Final    BOTTLES DRAWN AEROBIC AND ANAEROBIC  Blood Culture results may not be optimal due to an inadequate volume of blood received in culture bottles Performed at [facility], [address], [place], [place] [address]    Culture  Setup Time   Final    GRAM NEGATIVE RODS IN BOTH AEROBIC AND ANAEROBIC BOTTLES CRITICAL RESULT CALLED TO, READ BACK BY AND VERIFIED WITH: Annamaria Boots 03/10/24 0981 MW Performed at [facility], [address] [place], [place] [address]    Culture PROTEUS MIRABILIS (A)  Final   Report Status 03/12/2024 FINAL  Final   Organism ID, Bacteria PROTEUS MIRABILIS  Final   Organism ID, Bacteria PROTEUS MIRABILIS  Final      Susceptibility   Proteus mirabilis - KIRBY BAUER*    CEFAZOLIN INTERMEDIATE Intermediate    Proteus mirabilis - MIC*    AMPICILLIN <=2 SENSITIVE Sensitive     CEFEPIME <=0.12 SENSITIVE Sensitive     CEFTAZIDIME <=1 SENSITIVE Sensitive     CEFTRIAXONE <=0.25 SENSITIVE Sensitive     CIPROFLOXACIN <=0.25 SENSITIVE Sensitive  GENTAMICIN <=1 SENSITIVE Sensitive     IMIPENEM 2 SENSITIVE Sensitive     TRIMETH/SULFA <=20 SENSITIVE Sensitive     AMPICILLIN/SULBACTAM <=2 SENSITIVE Sensitive     PIP/TAZO <=4 SENSITIVE Sensitive ug/mL    * PROTEUS MIRABILIS    PROTEUS MIRABILIS  Blood culture (routine x 2)     Status: Abnormal   Collection Time: 03/09/24  3:25 PM   Specimen: BLOOD  Result Value Ref Range Status   Specimen Description   Final    BLOOD RIGHT ANTECUBITAL Performed at [facility], [address] [place], [place] [address]    Special Requests   Final    BOTTLES DRAWN AEROBIC AND ANAEROBIC Blood Culture results may not be optimal due to an inadequate volume of blood received in culture bottles Performed at [facility], [address] [place], [place] [address]    Culture  Setup Time   Final    GRAM NEGATIVE RODS IN BOTH AEROBIC AND ANAEROBIC BOTTLES RBV NATHAN BLEU 03/10/24 0614 MW GRAM STAIN REVIEWED-AGREE WITH RESULT DRT    Culture  (A)  Final    PROTEUS MIRABILIS SUSCEPTIBILITIES PERFORMED ON PREVIOUS CULTURE WITHIN THE LAST 5 DAYS. Performed at [facility], [address] [place], [place] [address]    Report Status 03/12/2024 FINAL  Final  Blood Culture ID Panel (Reflexed)     Status: Abnormal   Collection Time: 03/09/24  3:25 PM  Result Value Ref Range Status   Enterococcus faecalis NOT DETECTED NOT DETECTED Final   Enterococcus Faecium NOT DETECTED NOT DETECTED Final   Listeria monocytogenes NOT DETECTED NOT DETECTED Final   Staphylococcus species NOT DETECTED NOT DETECTED Final   Staphylococcus aureus (BCID) NOT DETECTED NOT DETECTED Final   Staphylococcus epidermidis NOT DETECTED NOT DETECTED Final   Staphylococcus lugdunensis NOT DETECTED NOT DETECTED Final   Streptococcus species NOT DETECTED NOT DETECTED Final   Streptococcus agalactiae NOT DETECTED NOT DETECTED Final   Streptococcus pneumoniae NOT DETECTED NOT DETECTED Final   Streptococcus pyogenes NOT DETECTED NOT DETECTED Final   A.calcoaceticus-baumannii NOT DETECTED NOT DETECTED Final   Bacteroides fragilis NOT DETECTED NOT DETECTED Final   Enterobacterales DETECTED (A) NOT DETECTED Final    Comment: Enterobacterales represent a large order of gram negative bacteria, not a single organism. CRITICAL RESULT CALLED TO, READ BACK BY AND VERIFIED WITH: NATHAN BLEU 03/10/24 0614 MW    Enterobacter cloacae complex NOT DETECTED NOT DETECTED Final   Escherichia coli NOT DETECTED NOT DETECTED Final   Klebsiella aerogenes NOT DETECTED NOT DETECTED Final   Klebsiella oxytoca NOT DETECTED NOT DETECTED Final   Klebsiella pneumoniae NOT DETECTED NOT DETECTED Final   Proteus species DETECTED (A) NOT DETECTED Final    Comment: CRITICAL RESULT CALLED TO, READ BACK BY AND VERIFIED WITH: NATHAN BLEU 03/10/24 0614 MW    Salmonella species NOT DETECTED NOT DETECTED Final   Serratia marcescens NOT DETECTED NOT DETECTED Final   Haemophilus influenzae NOT  DETECTED NOT DETECTED Final   Neisseria meningitidis NOT DETECTED NOT DETECTED Final   Pseudomonas aeruginosa NOT DETECTED NOT DETECTED Final   Stenotrophomonas maltophilia NOT DETECTED NOT DETECTED Final   Candida albicans NOT DETECTED NOT DETECTED Final   Candida auris NOT DETECTED NOT DETECTED Final   Candida glabrata NOT DETECTED NOT DETECTED Final   Candida krusei NOT DETECTED NOT DETECTED Final   Candida parapsilosis NOT DETECTED NOT DETECTED Final   Candida tropicalis NOT DETECTED NOT DETECTED Final   Cryptococcus neoformans/gattii  NOT DETECTED NOT DETECTED Final   CTX-M ESBL NOT DETECTED NOT DETECTED Final   Carbapenem resistance IMP NOT DETECTED NOT DETECTED Final   Carbapenem resistance KPC NOT DETECTED NOT DETECTED Final   Carbapenem resistance NDM NOT DETECTED NOT DETECTED Final   Carbapenem resist OXA 48 LIKE NOT DETECTED NOT DETECTED Final   Carbapenem resistance VIM NOT DETECTED NOT DETECTED Final    Comment: Performed at [facility], [address] [place], [place] [address]  Urine Culture     Status: Abnormal   Collection Time: 03/10/24 10:53 AM   Specimen: Urine, Cystoscope  Result Value Ref Range Status   Specimen Description   Final    URINE, RANDOM Urine, Cysto Performed at [facility], [address] [place], [place] [address]    Special Requests   Final    NONE Performed at [facility], [address] [place], [place] [address]    Culture 4,000 COLONIES/mL PROTEUS MIRABILIS (A)  Final   Report Status 03/12/2024 FINAL  Final   Organism ID, Bacteria PROTEUS MIRABILIS (A)  Final      Susceptibility   Proteus mirabilis - MIC*    AMPICILLIN <=2 SENSITIVE Sensitive     CEFAZOLIN <=4 SENSITIVE Sensitive     CEFEPIME <=0.12 SENSITIVE Sensitive     CEFTRIAXONE <=0.25 SENSITIVE Sensitive     CIPROFLOXACIN <=0.25 SENSITIVE Sensitive     GENTAMICIN <=1 SENSITIVE Sensitive     IMIPENEM 2 SENSITIVE Sensitive     NITROFURANTOIN  RESISTANT Resistant     TRIMETH/SULFA <=20 SENSITIVE Sensitive     AMPICILLIN/SULBACTAM <=2 SENSITIVE Sensitive     PIP/TAZO <=4 SENSITIVE Sensitive ug/mL    * 4,000 COLONIES/mL PROTEUS MIRABILIS  Culture, blood (Routine X 2) w Reflex to ID Panel     Status: None (Preliminary result)   Collection Time: 03/11/24  1:32 PM   Specimen: BLOOD  Result Value Ref Range Status   Specimen Description BLOOD BLOOD RIGHT HAND  Final   Special Requests   Final    BOTTLES DRAWN AEROBIC AND ANAEROBIC Blood Culture adequate volume   Culture   Final    NO GROWTH < 24 HOURS Performed at [facility], [address] [place], [place] [address]    Report Status PENDING  Incomplete  Culture, blood (Routine X 2) w Reflex to ID Panel     Status: None (Preliminary result)   Collection Time: 03/11/24  1:33 PM   Specimen: BLOOD  Result Value Ref Range Status   Specimen Description BLOOD BLOOD LEFT HAND  Final   Special Requests   Final    BOTTLES DRAWN AEROBIC AND ANAEROBIC Blood Culture adequate volume   Culture   Final    NO GROWTH < 24 HOURS Performed at [facility], [address] [place], [place] [address]    Report Status PENDING  Incomplete    Labs: CBC: Recent Labs  Lab 03/09/24 1030 03/10/24 0606 03/11/24 0157  WBC 18.8* 17.6* 14.6*  HGB 16.4 14.4 13.4  HCT 48.5 42.9 41.1  MCV 91.9 93.3 94.3  PLT 279 219 196   Basic Metabolic Panel: Recent Labs  Lab 03/09/24 1030 03/10/24 0606 03/11/24 0157 03/12/24 0438  NA 139 136 136 137  K 3.6 3.9 3.6 3.5  CL 107 106 107 107  CO2 21* 22 20* 21*  GLUCOSE 205* 185* 94 117*  BUN 56* 57* 59* 55*  CREATININE 2.59* 2.99* 2.83* 2.40*  CALCIUM 9.8 8.9 8.6*  8.5*  MG  --   --   --  2.3   Liver Function Tests: Recent Labs  Lab 03/10/24 0606  AST 15  ALT 16  ALKPHOS 33*  BILITOT 0.9  PROT 6.7  ALBUMIN 3.1*   CBG: Recent Labs  Lab 03/11/24 0831 03/11/24 1203 03/11/24 1634 03/11/24 2115 03/12/24 0743  GLUCAP  111* 150* 99 104* 117*    Discharge time spent: greater than 30 minutes.  Signed: Donaciano Frizzle, MD [facility] 03/12/2024

## 2024-03-16 ENCOUNTER — Telehealth: Payer: Self-pay

## 2024-03-16 ENCOUNTER — Other Ambulatory Visit: Payer: Self-pay

## 2024-03-16 DIAGNOSIS — N200 Calculus of kidney: Secondary | ICD-10-CM

## 2024-03-16 LAB — CULTURE, BLOOD (ROUTINE X 2)
Culture: NO GROWTH
Culture: NO GROWTH
Special Requests: ADEQUATE
Special Requests: ADEQUATE

## 2024-03-16 NOTE — Progress Notes (Signed)
   [facility] Surgical Posting Form  Surgery Date: Date: 03/24/2024  Surgeon: Dr. Darlynn Elam, MD  Inpt ( No  )   Outpt (Yes)   Obs ( No  )   Diagnosis: N20.0 Left Nephrolithiasis  -CPT: [phone]  Surgery: Left Ureteroscopy with Laser Lithotripsy and Stent Exchange  Stop Anticoagulations: N/A  Cardiac/Medical/Pulmonary Clearance needed: no  *Orders entered into EPIC  Date: 03/16/24   *Case booked in [place]  Date: 03/16/24  *Notified pt of Surgery: Date: 03/16/24  PRE-OP UA & CX: no  *Placed into Prior Authorization Work [place] Date: 03/16/24  Assistant/laser/rep:No

## 2024-03-16 NOTE — Telephone Encounter (Signed)
 Per Dr. Cherylene Corrente, Patient is to be scheduled for  Left Ureteroscopy with Laser Lithotripsy and Stent Exchange   [name] was contacted and possible surgical dates were discussed, Tuesday April 29th, 2025 was agreed upon for surgery.  Patient was directed to call [phone] between 1-3pm the day before surgery to find out surgical arrival time.  Instructions were given not to eat or drink from midnight on the night before surgery and have a driver for the day of surgery. On the surgery day patient was instructed to enter through the [facility] entrance of [facility] report the Same Day Surgery desk.   Pre-Admit Testing will be in contact via phone to set up an interview with the anesthesia team to review your history and medications prior to surgery.   Reminder of this information was sent via MyChart to the patient.

## 2024-03-16 NOTE — Progress Notes (Signed)
 Surgical Physician Order Form [facility] [place]  Dr. Cherylene Corrente * Scheduling expectation : 2-3 weeks  *Length of Case:   *Clearance needed: no  *Anticoagulation Instructions: N/A  *Aspirin Instructions: Ok to continue Aspirin  *Post-op visit Date/Instructions:  TBD  *Diagnosis: Left Nephrolithiasis  *Procedure: left  Ureteroscopy w/laser lithotripsy & stent exchange ([address])   Additional orders: N/A  -Admit type: OUTpatient  -Anesthesia: General  -VTE Prophylaxis Standing Order SCD's       Other:   -Standing Lab Orders Per Anesthesia    Lab other: None  -Standing Test orders EKG/Chest x-ray per Anesthesia       Test other:   - Medications:  TBD per inpatient cx  -Other orders:  N/A  NOTE: ON GLP-1

## 2024-03-18 NOTE — Anesthesia Postprocedure Evaluation (Signed)
 Anesthesia Post Note  Patient: Stephen Mahoney  Procedure(s) Performed: CYSTOSCOPY, WITH STENT INSERTION (Left: Penis) CYSTOSCOPY, WITH RETROGRADE PYELOGRAM (Penis)  Patient location during evaluation: PACU Anesthesia Type: General Level of consciousness: awake and alert Pain management: pain level controlled Vital Signs Assessment: post-procedure vital signs reviewed and stable Respiratory status: spontaneous breathing, nonlabored ventilation, respiratory function stable and patient connected to nasal cannula oxygen Cardiovascular status: blood pressure returned to baseline and stable Postop Assessment: no apparent nausea or vomiting Anesthetic complications: no   There were no known notable events for this encounter.   Last Vitals:  Vitals:   03/12/24 0542 03/12/24 0744  BP: 115/82 (!) 152/65  Pulse: (!) 51 (!) 44  Resp: 18 20  Temp: 36.7 C 36.9 C  SpO2: 95% 94%    Last Pain:  Vitals:   03/12/24 1100  TempSrc:   PainSc: 0-No pain                 Zula Hitch

## 2024-03-19 ENCOUNTER — Encounter
Admission: RE | Admit: 2024-03-19 | Discharge: 2024-03-19 | Disposition: A | Discharge status: Home / Self Care | Source: Ambulatory Visit | Attending: Urology | Admitting: Urology

## 2024-03-19 DIAGNOSIS — E119 Type 2 diabetes mellitus without complications: Secondary | ICD-10-CM

## 2024-03-19 HISTORY — DX: Cardiac murmur, unspecified: R01.1

## 2024-03-19 HISTORY — DX: Sleep apnea, unspecified: G47.30

## 2024-03-19 HISTORY — DX: Diverticulosis of intestine, part unspecified, without perforation or abscess without bleeding: K57.90

## 2024-03-19 HISTORY — DX: Type 2 diabetes mellitus without complications: E11.9

## 2024-03-19 HISTORY — DX: Insomnia, unspecified: G47.00

## 2024-03-19 HISTORY — DX: Morbid (severe) obesity due to excess calories: E66.01

## 2024-03-19 HISTORY — DX: Atherosclerosis of aorta: I70.0

## 2024-03-19 HISTORY — DX: Chronic kidney disease, stage 3b: N18.32

## 2024-03-19 HISTORY — DX: Personal history of urinary calculi: Z87.442

## 2024-03-19 HISTORY — DX: Personal history of nicotine dependence: Z87.891

## 2024-03-19 HISTORY — DX: Proteus (mirabilis) (morganii) as the cause of diseases classified elsewhere: B96.4

## 2024-03-19 HISTORY — DX: Male erectile dysfunction, unspecified: N52.9

## 2024-03-19 NOTE — Patient Instructions (Addendum)
 Your procedure is scheduled on:03-24-24 Tuesday Report to the Registration Desk on the 1st floor of the [facility].Then proceed to the 2nd floor Surgery Desk To find out your arrival time, please call [phone] between 1PM - 3PM on:03-23-24 Monday If your arrival time is 6:00 am, do not arrive before that time as the [facility] entrance doors do not open until 6:00 am.  REMEMBER: Instructions that are not followed completely may result in serious medical risk, up to and including death; or upon the discretion of your surgeon and anesthesiologist your surgery may need to be rescheduled.  Do not eat food OR drink any liquids after midnight the night before surgery.  No gum chewing or hard candies.  One week prior to surgery:Stop NOW (03-19-24) Stop Anti-inflammatories (NSAIDS) such as Advil, Aleve, Ibuprofen, Motrin, Naproxen, Naprosyn and Aspirin based products such as Excedrin, Goody's Powder, BC Powder. Stop ANY OVER THE COUNTER supplements until after surgery.  You may however, continue to take Tylenol  if needed for pain up until the day of surgery.  Stop FARXIGA 3 days prior to surgery-Last dose will be on 03-20-24 Friday  Continue taking all of your other prescription medications up until the day of surgery.  ON THE DAY OF SURGERY ONLY TAKE THESE MEDICATIONS WITH SIPS OF WATER : -amLODipine (NORVASC)   Take half of your Lantus  Insulin  (4 units) the night before surgery and NO Insulin  the morning of surgery  No Alcohol for 24 hours before or after surgery.  No Smoking including e-cigarettes for 24 hours before surgery.  No chewable tobacco products for at least 6 hours before surgery.  No nicotine patches on the day of surgery.  Do not use any "recreational" drugs for at least a week (preferably 2 weeks) before your surgery.  Please be advised that the combination of cocaine and anesthesia may have negative outcomes, up to and including death. If you test positive for  cocaine, your surgery will be cancelled.  On the morning of surgery brush your teeth with toothpaste and water , you may rinse your mouth with mouthwash if you wish. Do not swallow any toothpaste or mouthwash.  Do not wear jewelry, make-up, hairpins, clips or nail polish.  For welded (permanent) jewelry: bracelets, anklets, waist bands, etc.  Please have this removed prior to surgery.  If it is not removed, there is a chance that hospital personnel will need to cut it off on the day of surgery.  Do not wear lotions, powders, or perfumes.   Do not shave body hair from the neck down 48 hours before surgery.  Contact lenses, hearing aids and dentures may not be worn into surgery.  Do not bring valuables to the hospital. [facility] is not responsible for any missing/lost belongings or valuables.   Notify your doctor if there is any change in your medical condition (cold, fever, infection).  Wear comfortable clothing (specific to your surgery type) to the hospital.  After surgery, you can help prevent lung complications by doing breathing exercises.  Take deep breaths and cough every 1-2 hours. Your doctor may order a device called an Incentive Spirometer to help you take deep breaths. When coughing or sneezing, hold a pillow firmly against your incision with both hands. This is called "splinting." Doing this helps protect your incision. It also decreases belly discomfort.  If you are being admitted to the hospital overnight, leave your suitcase in the car. After surgery it may be brought to your room.  In case  of increased patient census, it may be necessary for you, the patient, to continue your postoperative care in the Same Day Surgery department.  If you are being discharged the day of surgery, you will not be allowed to drive home. You will need a responsible individual to drive you home and stay with you for 24 hours after surgery.   If you are taking public transportation, you will  need to have a responsible individual with you.  Please call the Pre-admissions Testing Dept. at [phone] if you have any questions about these instructions.  Surgery Visitation Policy:  Patients having surgery or a procedure may have two visitors.  Children under the age of 4 must have an adult with them who is not the patient.

## 2024-03-24 ENCOUNTER — Other Ambulatory Visit: Payer: Self-pay

## 2024-03-24 ENCOUNTER — Ambulatory Visit: Admitting: Anesthesiology

## 2024-03-24 ENCOUNTER — Ambulatory Visit

## 2024-03-24 ENCOUNTER — Encounter: Payer: Self-pay | Admitting: Urology

## 2024-03-24 ENCOUNTER — Encounter
Admission: RE | Disposition: A | Discharge status: Home / Self Care | Payer: Self-pay | Source: Home / Self Care | Attending: Urology

## 2024-03-24 ENCOUNTER — Ambulatory Visit
Admission: RE | Admit: 2024-03-24 | Discharge: 2024-03-24 | Disposition: A | Discharge status: Home / Self Care | Attending: Urology | Admitting: Urology

## 2024-03-24 DIAGNOSIS — E669 Obesity, unspecified: Secondary | ICD-10-CM | POA: Diagnosis not present

## 2024-03-24 DIAGNOSIS — Z794 Long term (current) use of insulin: Secondary | ICD-10-CM | POA: Insufficient documentation

## 2024-03-24 DIAGNOSIS — I700 Atherosclerosis of aorta: Secondary | ICD-10-CM | POA: Insufficient documentation

## 2024-03-24 DIAGNOSIS — N200 Calculus of kidney: Secondary | ICD-10-CM

## 2024-03-24 DIAGNOSIS — Z7984 Long term (current) use of oral hypoglycemic drugs: Secondary | ICD-10-CM | POA: Insufficient documentation

## 2024-03-24 DIAGNOSIS — Z6838 Body mass index (BMI) 38.0-38.9, adult: Secondary | ICD-10-CM | POA: Diagnosis not present

## 2024-03-24 DIAGNOSIS — E1122 Type 2 diabetes mellitus with diabetic chronic kidney disease: Secondary | ICD-10-CM | POA: Diagnosis not present

## 2024-03-24 DIAGNOSIS — N179 Acute kidney failure, unspecified: Secondary | ICD-10-CM | POA: Diagnosis not present

## 2024-03-24 DIAGNOSIS — G4730 Sleep apnea, unspecified: Secondary | ICD-10-CM | POA: Diagnosis not present

## 2024-03-24 DIAGNOSIS — N136 Pyonephrosis: Secondary | ICD-10-CM | POA: Insufficient documentation

## 2024-03-24 DIAGNOSIS — K4020 Bilateral inguinal hernia, without obstruction or gangrene, not specified as recurrent: Secondary | ICD-10-CM | POA: Insufficient documentation

## 2024-03-24 DIAGNOSIS — M47816 Spondylosis without myelopathy or radiculopathy, lumbar region: Secondary | ICD-10-CM | POA: Diagnosis not present

## 2024-03-24 DIAGNOSIS — Z87891 Personal history of nicotine dependence: Secondary | ICD-10-CM | POA: Diagnosis not present

## 2024-03-24 DIAGNOSIS — I129 Hypertensive chronic kidney disease with stage 1 through stage 4 chronic kidney disease, or unspecified chronic kidney disease: Secondary | ICD-10-CM | POA: Insufficient documentation

## 2024-03-24 DIAGNOSIS — M47814 Spondylosis without myelopathy or radiculopathy, thoracic region: Secondary | ICD-10-CM | POA: Diagnosis not present

## 2024-03-24 DIAGNOSIS — E785 Hyperlipidemia, unspecified: Secondary | ICD-10-CM | POA: Insufficient documentation

## 2024-03-24 DIAGNOSIS — Z79899 Other long term (current) drug therapy: Secondary | ICD-10-CM | POA: Insufficient documentation

## 2024-03-24 DIAGNOSIS — N201 Calculus of ureter: Secondary | ICD-10-CM

## 2024-03-24 DIAGNOSIS — K5730 Diverticulosis of large intestine without perforation or abscess without bleeding: Secondary | ICD-10-CM | POA: Diagnosis not present

## 2024-03-24 DIAGNOSIS — N189 Chronic kidney disease, unspecified: Secondary | ICD-10-CM | POA: Diagnosis not present

## 2024-03-24 HISTORY — PX: CYSTOSCOPY/URETEROSCOPY/HOLMIUM LASER/STENT PLACEMENT: SHX6546

## 2024-03-24 LAB — GLUCOSE, CAPILLARY
Glucose-Capillary: 140 mg/dL — ABNORMAL HIGH (ref 70–99)
Glucose-Capillary: 136 mg/dL — ABNORMAL HIGH (ref 70–99)

## 2024-03-24 SURGERY — CYSTOSCOPY/URETEROSCOPY/HOLMIUM LASER/STENT PLACEMENT
Anesthesia: General | Site: Ureter | Laterality: Left

## 2024-03-24 MED ORDER — STERILE WATER FOR IRRIGATION IR SOLN
Status: DC | PRN
  Administered 2024-03-24: 500 mL

## 2024-03-24 MED ORDER — CHLORHEXIDINE GLUCONATE 0.12 % MT SOLN
15.0000 mL | Freq: Once | OROMUCOSAL | Status: AC
  Administered 2024-03-24: 15 mL via OROMUCOSAL

## 2024-03-24 MED ORDER — PROPOFOL 10 MG/ML IV BOLUS
INTRAVENOUS | Status: DC | PRN
  Administered 2024-03-24: 150 mg via INTRAVENOUS

## 2024-03-24 MED ORDER — CEFAZOLIN SODIUM-DEXTROSE 2-4 GM/100ML-% IV SOLN
INTRAVENOUS | Status: AC
  Filled 2024-03-24: qty 100

## 2024-03-24 MED ORDER — ONDANSETRON HCL 4 MG/2ML IJ SOLN
INTRAMUSCULAR | Status: AC
  Filled 2024-03-24: qty 2

## 2024-03-24 MED ORDER — CEFAZOLIN SODIUM-DEXTROSE 2-4 GM/100ML-% IV SOLN
2.0000 g | INTRAVENOUS | Status: AC
  Administered 2024-03-24: 3 g via INTRAVENOUS

## 2024-03-24 MED ORDER — SODIUM CHLORIDE 0.9 % IR SOLN
Status: DC | PRN
  Administered 2024-03-24: 3000 mL

## 2024-03-24 MED ORDER — CEFAZOLIN SODIUM 1 G IJ SOLR
INTRAMUSCULAR | Status: AC
  Filled 2024-03-24: qty 10

## 2024-03-24 MED ORDER — LACTATED RINGERS IV SOLN: INTRAVENOUS | Status: DC | PRN

## 2024-03-24 MED ORDER — SUGAMMADEX SODIUM 200 MG/2ML IV SOLN
INTRAVENOUS | Status: DC | PRN
  Administered 2024-03-24: 300 mg via INTRAVENOUS

## 2024-03-24 MED ORDER — PROPOFOL 10 MG/ML IV BOLUS
INTRAVENOUS | Status: AC
  Filled 2024-03-24: qty 20

## 2024-03-24 MED ORDER — ORAL CARE MOUTH RINSE: 15.0000 mL | Freq: Once | OROMUCOSAL | Status: AC

## 2024-03-24 MED ORDER — SUCCINYLCHOLINE CHLORIDE 200 MG/10ML IV SOSY
PREFILLED_SYRINGE | INTRAVENOUS | Status: DC | PRN
  Administered 2024-03-24: 120 mg via INTRAVENOUS

## 2024-03-24 MED ORDER — DEXAMETHASONE SODIUM PHOSPHATE 10 MG/ML IJ SOLN
INTRAMUSCULAR | Status: DC | PRN
  Administered 2024-03-24: 5 mg via INTRAVENOUS

## 2024-03-24 MED ORDER — LIDOCAINE HCL (PF) 2 % IJ SOLN
INTRAMUSCULAR | Status: AC
  Filled 2024-03-24: qty 5

## 2024-03-24 MED ORDER — DEXAMETHASONE SODIUM PHOSPHATE 10 MG/ML IJ SOLN
INTRAMUSCULAR | Status: AC
  Filled 2024-03-24: qty 1

## 2024-03-24 MED ORDER — FENTANYL CITRATE (PF) 100 MCG/2ML IJ SOLN
INTRAMUSCULAR | Status: DC | PRN
  Administered 2024-03-24: 25 ug via INTRAVENOUS
  Administered 2024-03-24: 50 ug via INTRAVENOUS
  Administered 2024-03-24: 25 ug via INTRAVENOUS

## 2024-03-24 MED ORDER — FENTANYL CITRATE (PF) 100 MCG/2ML IJ SOLN
INTRAMUSCULAR | Status: AC
  Filled 2024-03-24: qty 2

## 2024-03-24 MED ORDER — IOHEXOL 180 MG/ML  SOLN
INTRAMUSCULAR | Status: DC | PRN
  Administered 2024-03-24: 10 mL

## 2024-03-24 MED ORDER — ROCURONIUM BROMIDE 100 MG/10ML IV SOLN
INTRAVENOUS | Status: DC | PRN
  Administered 2024-03-24: 10 mg via INTRAVENOUS
  Administered 2024-03-24: 20 mg via INTRAVENOUS

## 2024-03-24 MED ORDER — HYDROCODONE-ACETAMINOPHEN 5-325 MG PO TABS: 1.0000 | ORAL_TABLET | Freq: Four times a day (QID) | ORAL | 0 refills | Status: AC | PRN

## 2024-03-24 MED ORDER — ONDANSETRON HCL 4 MG/2ML IJ SOLN
INTRAMUSCULAR | Status: DC | PRN
  Administered 2024-03-24: 4 mg via INTRAVENOUS

## 2024-03-24 MED ORDER — SODIUM CHLORIDE 0.9 % IV SOLN: INTRAVENOUS | Status: DC

## 2024-03-24 MED ORDER — GLYCOPYRROLATE 0.2 MG/ML IJ SOLN
INTRAMUSCULAR | Status: DC | PRN
  Administered 2024-03-24: .2 mg via INTRAVENOUS

## 2024-03-24 MED ORDER — CHLORHEXIDINE GLUCONATE 0.12 % MT SOLN
OROMUCOSAL | Status: AC
  Filled 2024-03-24: qty 15

## 2024-03-24 MED ORDER — LIDOCAINE HCL (CARDIAC) PF 100 MG/5ML IV SOSY
PREFILLED_SYRINGE | INTRAVENOUS | Status: DC | PRN
  Administered 2024-03-24: 80 mg via INTRAVENOUS

## 2024-03-24 SURGICAL SUPPLY — 23 items
BAG DRAIN SIEMENS DORNER NS (MISCELLANEOUS) ×1 IMPLANT
BASKET LASER NITINOL 1.9FR (BASKET) IMPLANT
BASKET ZERO TIP 1.9FR (BASKET) IMPLANT
BRUSH SCRUB EZ 1% IODOPHOR (MISCELLANEOUS) ×1 IMPLANT
CATH URET FLEX-TIP 2 LUMEN 10F (CATHETERS) IMPLANT
CATH URETL OPEN END 6X70 (CATHETERS) IMPLANT
CNTNR URN SCR LID CUP LEK RST (MISCELLANEOUS) IMPLANT
DRAPE UTILITY 15X26 TOWEL STRL (DRAPES) ×1 IMPLANT
FIBER LASER MOSES 200 DFL (Laser) IMPLANT
GLOVE BIOGEL PI IND STRL 7.5 (GLOVE) ×1 IMPLANT
GOWN STRL REUS W/ TWL LRG LVL3 (GOWN DISPOSABLE) ×1 IMPLANT
GOWN STRL REUS W/ TWL XL LVL3 (GOWN DISPOSABLE) ×1 IMPLANT
GUIDEWIRE STR DUAL SENSOR (WIRE) ×1 IMPLANT
KIT TURNOVER CYSTO (KITS) ×1 IMPLANT
PACK CYSTO AR (MISCELLANEOUS) ×1 IMPLANT
SET CYSTO W/LG BORE CLAMP LF (SET/KITS/TRAYS/PACK) ×1 IMPLANT
SHEATH NAVIGATOR HD 12/14X36 (SHEATH) IMPLANT
SOL .9 NS 3000ML IRR UROMATIC (IV SOLUTION) ×1 IMPLANT
STENT URET 6FRX24 CONTOUR (STENTS) IMPLANT
STENT URET 6FRX26 CONTOUR (STENTS) IMPLANT
SURGILUBE 2OZ TUBE FLIPTOP (MISCELLANEOUS) ×1 IMPLANT
VALVE UROSEAL ADJ ENDO (VALVE) IMPLANT
WATER STERILE IRR 500ML POUR (IV SOLUTION) ×1 IMPLANT

## 2024-03-24 NOTE — Anesthesia Postprocedure Evaluation (Signed)
 Anesthesia Post Note  Patient: Stephen Mahoney  Procedure(s) Performed: CYSTOSCOPY/URETEROSCOPY/HOLMIUM LASER/STENT PLACEMENT (Left: Ureter)  Patient location during evaluation: PACU Anesthesia Type: General Level of consciousness: awake and alert Pain management: pain level controlled Vital Signs Assessment: post-procedure vital signs reviewed and stable Respiratory status: spontaneous breathing, nonlabored ventilation, respiratory function stable and patient connected to nasal cannula oxygen Cardiovascular status: blood pressure returned to baseline and stable Postop Assessment: no apparent nausea or vomiting Anesthetic complications: no   No notable events documented.   Last Vitals:  Vitals:   03/24/24 1700 03/24/24 1707  BP: (!) 185/75 (!) 150/92  Pulse:    Resp:  (!) 22  Temp:  (!) 36.1 C  SpO2:  92%    Last Pain:  Vitals:   03/24/24 1707  TempSrc:   PainSc: 0-No pain                 Lattie Poli

## 2024-03-24 NOTE — Anesthesia Preprocedure Evaluation (Signed)
 Anesthesia Evaluation  Patient identified by MRN, date of birth, ID band Patient awake    Reviewed: Allergy & Precautions, H&P , NPO status , Patient's Chart, lab work & pertinent test results  History of Anesthesia Complications Negative for: history of anesthetic complications  Airway Mallampati: IV  TM Distance: >3 FB Neck ROM: full    Dental  (+) Dental Advidsory Given, Caps, Teeth Intact   Pulmonary neg shortness of breath, sleep apnea , neg COPD, neg recent URI, former smoker   Pulmonary exam normal        Cardiovascular Exercise Tolerance: Poor hypertension, (-) angina + DOE  (-) Past MI, (-) Cardiac Stents and (-) Orthopnea (-) dysrhythmias + Valvular Problems/Murmurs  + Systolic murmurs    Neuro/Psych negative neurological ROS  negative psych ROS   GI/Hepatic negative GI ROS, Neg liver ROS,,,  Endo/Other  diabetes, Poorly Controlled, Type 2, Insulin  Dependent    Renal/GU Renal InsufficiencyRenal disease     Musculoskeletal   Abdominal  (+) + obese  Peds  Hematology negative hematology ROS (+)   Anesthesia Other Findings Left UPJ calculus with chills and coughing   ECHO 2023: NORMAL LEFT VENTRICULAR SYSTOLIC FUNCTION  NORMAL RIGHT VENTRICULAR SYSTOLIC FUNCTION  TRIVIAL REGURGITATION NOTED (See above)  NO VALVULAR STENOSIS    Past Medical History: No date: Actinic keratosis 05/07/2013: Basal cell carcinoma     Comment:  Right mid to distal dorsum nose.  01/15/2018: Basal cell carcinoma     Comment:  Left medial pectoral. Nodular, ulcerated. 04/21/2020: Basal cell carcinoma     Comment:  Left mid lat. pretibial. Nodular. EDC. No date: BPH (benign prostatic hyperplasia) No date: Chronic kidney disease No date: Chronic kidney disease No date: Diabetes mellitus without complication (HCC) No date: High blood triglycerides No date: Hyperlipidemia with low HDL No date: Hypertension No date:  Obesity  Past Surgical History: No date: COLON SURGERY 02/01/2022: COLONOSCOPY; N/A     Comment:  Procedure: COLONOSCOPY;  Surgeon: Quintin Buckle,              DO;  Location: [facility] ENDOSCOPY;  Service:               Gastroenterology;  Laterality: N/A;  DM No date: COLONOSCOPY W/ POLYPECTOMY 04/12/2016: COLONOSCOPY WITH PROPOFOL ; N/A     Comment:  Procedure: COLONOSCOPY WITH PROPOFOL ;  Surgeon: Cassie Click, MD;  Location: [facility] ENDOSCOPY;  Service:               Endoscopy;  Laterality: N/A; No date: MM BCCCP - DIAG MAMMO W/CAD ([facility] HX); N/A     Comment:  left lip nose No date: TONSILLECTOMY  BMI    Body Mass Index: 38.52 kg/m      Reproductive/Obstetrics negative OB ROS                             Anesthesia Physical Anesthesia Plan  ASA: 3  Anesthesia Plan: General   Post-op Pain Management: Ofirmev  IV (intra-op)*, Precedex and Tylenol  PO (pre-op)*   Induction: Intravenous  PONV Risk Score and Plan: Dexamethasone, Ondansetron  and Treatment may vary due to age or medical condition  Airway Management Planned: LMA and Oral ETT  Additional Equipment:   Intra-op Plan:   Post-operative Plan: Extubation in OR  Informed Consent: I have reviewed the patients History and Physical, chart, labs and discussed the  procedure including the risks, benefits and alternatives for the proposed anesthesia with the patient or authorized representative who has indicated his/her understanding and acceptance.     Dental Advisory Given  Plan Discussed with: Anesthesiologist, CRNA and Surgeon  Anesthesia Plan Comments:         Anesthesia Quick Evaluation

## 2024-03-24 NOTE — Discharge Instructions (Signed)
 DISCHARGE INSTRUCTIONS FOR KIDNEY STONE/URETERAL STENT   MEDICATIONS:  1. Resume all your other meds from home.  2.  AZO (over-the-counter) can help with the burning/stinging when you urinate. 3.  Hydrocodone is for moderate/severe pain, Rx was sent to your pharmacy.  ACTIVITY:  1. May resume regular activities in 24 hours. 2. No driving while on narcotic pain medications  3. Drink plenty of water   4. Continue to walk at home - you can still get blood clots when you are at home, so keep active, but don't over do it.  5. May return to work/school tomorrow or when you feel ready    SIGNS/SYMPTOMS TO CALL:  Common postoperative symptoms include urinary frequency, urgency, bladder spasm and blood in the urine  Please call us  if you have a fever greater than 101.5, uncontrolled nausea/vomiting, uncontrolled pain, dizziness, unable to urinate, excessively bloody urine, chest pain, shortness of breath, leg swelling, leg pain, or any other concerns or questions.   You can reach us  at [phone].   FOLLOW-UP:  1.  Our office will contact you for an appointment for stent removal in 7-10 days

## 2024-03-24 NOTE — Op Note (Signed)
 Preoperative diagnosis:  Left nephrolithiasis   Postoperative diagnosis:  Left nephrolithiasis  Procedure:  Cystoscopy Left ureteroscopy and stone removal Ureteroscopic laser lithotripsy Left ureteral stent exchange (4F/26 cm)  Left retrograde pyelography with interpretation  Surgeon: Geralyn Knee C. Zymier Rodgers, M.D.  Anesthesia: General  Complications: None  Intraoperative findings:  Cystoscopy: Urethra normal in caliber without stricture.  Prostate with moderate lateral lobe enlargement and moderate bladder neck elevation with median lobe.  Bladder mucosa without solid or papillary lesions. Inflammatory changes UO secondary to indwelling stent. Ureteropyeloscopy: Normal-appearing ureteral mucosa without calculi, stricture or lesion.  Non-impacted renal pelvic calculus.  The outer layer was soft/sand colored and was easily dusted with a crystalline inner layer.  There was a large amount of white fibrinous organic material attached to the stone extending into a midpole calyx Left retrograde pyelography post procedure showed no filling defects, stone fragments or contrast extravasation  EBL: Minimal  Specimens: Calculus fragments for analysis   Indication: Stephen Mahoney is a 78 y.o. male status post placement of a left ureteral stent for an obstructing left renal pelvic calculus with pyuria and fever.  He was noted to have pyonephrosis on stent placement with culture + for Proteus mirabilis.  Refer to the admission H&P for details.  After reviewing the management options for treatment, the patient elected to proceed with the above surgical procedure(s). We have discussed the potential benefits and risks of the procedure, side effects of the proposed treatment, the likelihood of the patient achieving the goals of the procedure, and any potential problems that might occur during the procedure or recuperation. Informed consent has been obtained.  Description of procedure:  The patient was  taken to the operating room and general anesthesia was induced.  The patient was placed in the dorsal lithotomy position, prepped and draped in the usual sterile fashion, and preoperative antibiotics were administered. A preoperative time-out was performed.   A 21 Fr cystoscope was lubricated, placed per urethra and advanced proximally into the bladder under direct vision.  Panendoscopy was performed with findings described above.    The indwelling stent was grasped with endoscopic forceps and brought out through the urethral meatus.  The stent was cannulated with a 0.038 Sensor wire which was advanced into the renal pelvis under fluoroscopic guidance. A dual-lumen catheter was placed over the Sensor wire and a second Sensor wire was placed in a similar fashion.  A single channel digital flexible ureteroscope was advanced over the working wire to the lower proximal ureter.  The guidewire was then removed and the ureteroscope was easily advanced into the renal pelvis.  Pyeloscopy was performed with findings as described above.     A 200 m Moses holmium laser fiber was placed through the ureteroscope and the stone was fragmented at a setting of 0.3 J/40 hz.  Once the softer outer layer was treated settings were changed to 0.3J/80 Hz and the inner layer was dusted.  The fibrous material did make visualization difficult at times.  A large portion of the fibrous material was placed in a 1.55F Escape basket, removed.  Stone particles were present in this material and it was sent for analysis.  The ureteroscope was repassed and additional fibrinous material was removed.  Due to the volume of this material it was elected to place a 12/14 [place] ureteral access sheath which was done under fluoroscopic guidance without difficulty.  After several passes the fibrinous material was cleared.  The renal pelvis and midpole calyx were closely  inspected.  Additional stone fragments in the mid pole calyx were placed in the  basket, removed and sent for analysis.  Retrograde pyelogram was then performed through the ureteroscope with findings as described above.  All calyces were examined under fluoroscopic guidance and no significant size stone fragments were identified.  The ureteral access sheath and ureteroscope were removed in tandem and the ureter showed no evidence of injury or perforation.  A 6 F/26 cm Contour ureteral stent was placed under fluoroscopic guidance.  The wire was then removed with an adequate stent curl noted in the renal pelvis as well as in the bladder.  The bladder was then emptied and the procedure ended.  The patient appeared to tolerate the procedure well and without complications.  After anesthetic reversal the patient was transported to the PACU in stable condition.    Plan: Cystoscopy with stent removal will be scheduled in ~ 10 days   Darlynn Elam, MD

## 2024-03-24 NOTE — Transfer of Care (Signed)
 Immediate Anesthesia Transfer of Care Note  Patient: Stephen Mahoney  Procedure(s) Performed: CYSTOSCOPY/URETEROSCOPY/HOLMIUM LASER/STENT PLACEMENT (Left: Ureter)  Patient Location: PACU  Anesthesia Type:General  Level of Consciousness: drowsy  Airway & Oxygen Therapy: Patient Spontanous Breathing and Patient connected to face mask oxygen  Post-op Assessment: Report given to RN and Post -op Vital signs reviewed and stable  Post vital signs: Reviewed and stable  Last Vitals:  Vitals Value Taken Time  BP 158/70 03/24/24 1612  Temp    Pulse 45 03/24/24 1614  Resp 21 03/24/24 1614  SpO2 96 % 03/24/24 1614  Vitals shown include unfiled device data.  Last Pain:  Vitals:   03/24/24 1056  TempSrc: Temporal  PainSc: 0-No pain         Complications: No notable events documented.

## 2024-03-24 NOTE — Anesthesia Procedure Notes (Addendum)
 Procedure Name: Intubation Date/Time: 03/24/2024 2:07 PM  Performed by: Annamarie Kid, CRNAPre-anesthesia Checklist: Patient identified, Emergency Drugs available, Suction available and Patient being monitored Patient Re-evaluated:Patient Re-evaluated prior to induction Oxygen Delivery Method: Circle system utilized Preoxygenation: Pre-oxygenation with 100% oxygen Induction Type: IV induction Ventilation: Mask ventilation without difficulty Laryngoscope Size: McGrath and 4 (Initial attempt with a Mac 4 was challenging with a grade 4 view) Grade View: Grade I Tube type: Oral Tube size: 7.0 mm Number of attempts: 2 Airway Equipment and Method: Stylet and Oral airway Placement Confirmation: ETT inserted through vocal cords under direct vision, positive ETCO2 and breath sounds checked- equal and bilateral Secured at: 22 cm Tube secured with: Tape Dental Injury: Teeth and Oropharynx as per pre-operative assessment

## 2024-03-24 NOTE — Interval H&P Note (Signed)
 History and Physical Interval Note:  03/24/2024 1:48 PM  Stephen Mahoney  has presented today for surgery, with the diagnosis of Left Nephrolithiasis.  The various methods of treatment have been discussed with the patient and family. After consideration of risks, benefits and other options for treatment, the patient has consented to  Procedure(s) with comments: CYSTOSCOPY/URETEROSCOPY/HOLMIUM LASER/STENT PLACEMENT (Left) - EXCHANGE as a surgical intervention.  The patient's history has been reviewed, patient examined, no change in status, stable for surgery.  I have reviewed the patient's chart and labs.  Questions were answered to the patient's satisfaction.    [name] presents for definitive stone treatment.  All questions were answered  CV: RRR Lungs: Clear   Lorena Clearman C Delbert Darley

## 2024-03-25 ENCOUNTER — Encounter: Payer: Self-pay | Admitting: Urology

## 2024-03-30 ENCOUNTER — Telehealth: Payer: Self-pay

## 2024-03-30 MED ORDER — LEVOFLOXACIN 250 MG PO TABS: ORAL_TABLET | ORAL | 0 refills | Status: AC

## 2024-03-30 NOTE — Telephone Encounter (Signed)
 Patient's wife calling, pt had surgery with Dr Cherylene Corrente on 4/15 and 4/29- on 5/2 in the evening he had chills and a slight fever-she could not recall what it was, on 5/3 had severe chills, temp 101. Has been taking Tylenol  and it brings fever down but then goes back up, patient did not go anywhere to be checked. Today temp is 100, feels better than on 5/3. He is on Amoxicillin  still 2 tablets 2 times a day and has last dose of that to take tomorrow. No abnormal urine issues, no nausea or vomiting. They are wondering what to do about the fever? does he need a different antibiotic? wife states she was told patient had bladder infection and infection in his blood stream when he was at the hospital last but is not sure if that has been cleared and if this is the issue that is causing the fever? would like some guidance.  03/30/24 10:22 am AB Dustin Gimenez, MD He has a stent in place which is good. Lets switch his antibiotics 500 mg of Levaquin today and then 6 more days of 250. If he spikes another high fever he should go to the emergency room.   Mrs Spears advised and RX sent in.

## 2024-04-03 ENCOUNTER — Ambulatory Visit (INDEPENDENT_AMBULATORY_CARE_PROVIDER_SITE_OTHER): Admitting: Urology

## 2024-04-03 VITALS — BP 128/68 | HR 56 | Ht 74.0 in | Wt 290.0 lb

## 2024-04-03 DIAGNOSIS — N200 Calculus of kidney: Secondary | ICD-10-CM | POA: Diagnosis not present

## 2024-04-03 LAB — URINALYSIS, COMPLETE
Bilirubin, UA: NEGATIVE
Ketones, UA: NEGATIVE
Nitrite, UA: NEGATIVE
Protein,UA: NEGATIVE
Specific Gravity, UA: 1.015 (ref 1.005–1.030)
Urobilinogen, Ur: 0.2 mg/dL (ref 0.2–1.0)
pH, UA: 5.5 (ref 5.0–7.5)

## 2024-04-03 LAB — STONE ANALYSIS
Calcium Oxalate Dihydrate: 10 %
Calcium Oxalate Monohydrate: 85 %
Calcium Phosphate (Hydroxyl): 5 %
Weight Calculi: 9 mg

## 2024-04-03 LAB — MICROSCOPIC EXAMINATION: RBC, Urine: >30 /HPF — AB (ref 0–2)

## 2024-04-03 NOTE — Progress Notes (Signed)
   Indications: Patient is 78 y.o. male, who is s/p ureteroscopic removal of a 19 mm left UPJ stone 03/24/2024.  A ureteral stent was placed 03/10/2024 for UTI/pyonephrosis with urine culture positive for Proteus.  He has no postoperative complaints and has not had significant stent symptoms the patient is presenting today for stent removal.  He has 3 days of Levaquin  left.  UA dipstick 3+ glucose/2+ blood/trace ketones microscopy 6-10 WBC/>30 RBC.  Stone analysis 85% CaOxMono/10% CaOxDi/5% calcium phosphate  Procedure:  Flexible Cystoscopy with stent removal (69629)  Timeout was performed and the correct patient, procedure and participants were identified.    Description:  The patient was prepped and draped in the usual sterile fashion. Flexible cystosopy was performed.  The stent was visualized, grasped, and removed intact with moderate difficulty secondary to stent position. The patient tolerated the procedure well.    Complications:  None  Plan:  40-month follow-up with [facility] call earlier for fever/flank pain post stent removal We discussed metabolic evaluation however he has declined for now and he was provided literature on general stone prevention guidelines

## 2024-10-16 ENCOUNTER — Ambulatory Visit
Admission: RE | Admit: 2024-10-16 | Discharge: 2024-10-16 | Disposition: A | Discharge status: Home / Self Care | Source: Ambulatory Visit | Attending: Urology | Admitting: Urology

## 2024-10-16 ENCOUNTER — Ambulatory Visit (INDEPENDENT_AMBULATORY_CARE_PROVIDER_SITE_OTHER): Admitting: Urology

## 2024-10-16 ENCOUNTER — Encounter: Payer: Self-pay | Admitting: Urology

## 2024-10-16 ENCOUNTER — Ambulatory Visit
Admission: RE | Admit: 2024-10-16 | Discharge: 2024-10-16 | Disposition: A | Discharge status: Home / Self Care | Attending: Urology | Admitting: Urology

## 2024-10-16 VITALS — BP 167/92 | HR 67 | Ht 74.0 in | Wt 300.0 lb

## 2024-10-16 DIAGNOSIS — N200 Calculus of kidney: Secondary | ICD-10-CM

## 2024-10-16 DIAGNOSIS — N401 Enlarged prostate with lower urinary tract symptoms: Secondary | ICD-10-CM | POA: Diagnosis not present

## 2024-10-16 DIAGNOSIS — Z87442 Personal history of urinary calculi: Secondary | ICD-10-CM

## 2024-10-16 NOTE — Progress Notes (Signed)
 10/16/2024 9:06 AM   Stephen Mahoney 1946/08/10 [id number]  Referring provider: Alla Amis, MD [phone] [facility] [address] [facility] [place],  [place] [address]  Chief Complaint  Patient presents with   Nephrolithiasis   Urologic history: 1.  Nephrolithiasis Ureteral stent placement 03/10/2024 for an obstructing 19 mm left renal pelvic calculus with infection Ureteroscopic stone removal 03/24/2024 Stone analysis CaOxMono/CaOxDi/calcium phosphate: 85/10/5 CT showed no additional calculi; declined metabolic evaluation  2.  BPH with LUTS Combination therapy dutasteride/tamsulosin   HPI: Stephen Mahoney is a 78 y.o. male presents for a 52-month follow-up  No problems since last visit Has noted some lower extremity edema and has an appointment with Dr. Alla next month Denies flank, abdominal or pelvic pain No dysuria or gross hematuria  PMH: Past Medical History:  Diagnosis Date   Actinic keratosis    Aortic atherosclerosis    Bacteremia due to Proteus species    Basal cell carcinoma 05/07/2013   Right mid to distal dorsum nose.    Basal cell carcinoma 01/15/2018   Left medial pectoral. Nodular, ulcerated.   Basal cell carcinoma 04/21/2020   Left mid lat. pretibial. Nodular. EDC.   BPH (benign prostatic hyperplasia)    CKD stage 3b, GFR 30-44 ml/min (HCC)    Diverticulosis    DM (diabetes mellitus), type 2 (HCC)    ED (erectile dysfunction)    Former smoker    High blood triglycerides    History of kidney stones    Hyperlipidemia with low HDL    Hypertension    Insomnia    Morbid obesity (HCC)    Obesity    Sleep apnea    unable to tolerated cpap   Systolic ejection murmur     Surgical History: Past Surgical History:  Procedure Laterality Date   COLON SURGERY     COLONOSCOPY N/A 02/01/2022   Procedure: COLONOSCOPY;  Surgeon: Onita Elspeth Sharper, DO;  Location: [facility] ENDOSCOPY;  Service: Gastroenterology;  Laterality: N/A;  DM   COLONOSCOPY W/  POLYPECTOMY     COLONOSCOPY WITH PROPOFOL  N/A 04/12/2016   Procedure: COLONOSCOPY WITH PROPOFOL ;  Surgeon: Lamar ONEIDA Holmes, MD;  Location: [facility] ENDOSCOPY;  Service: Endoscopy;  Laterality: N/A;   CYSTOSCOPY W/ RETROGRADES N/A 03/10/2024   Procedure: CYSTOSCOPY, WITH RETROGRADE PYELOGRAM;  Surgeon: Twylla Stephen BROCKS, MD;  Location: [facility] ORS;  Service: Urology;  Laterality: N/A;   CYSTOSCOPY WITH STENT PLACEMENT Left 03/10/2024   Procedure: CYSTOSCOPY, WITH STENT INSERTION;  Surgeon: Twylla Stephen BROCKS, MD;  Location: [facility] ORS;  Service: Urology;  Laterality: Left;   CYSTOSCOPY/URETEROSCOPY/HOLMIUM LASER/STENT PLACEMENT Left 03/24/2024   Procedure: CYSTOSCOPY/URETEROSCOPY/HOLMIUM LASER/STENT PLACEMENT;  Surgeon: Twylla Stephen BROCKS, MD;  Location: [facility] ORS;  Service: Urology;  Laterality: Left;  EXCHANGE   MM BCCCP - DIAG MAMMO W/CAD ([facility] HX) N/A    left lip nose   TONSILLECTOMY      Home Medications:  Allergies as of 10/16/2024       Reactions   Sulfa Antibiotics Swelling, Rash        Medication List        Accurate as of October 16, 2024  9:06 AM. If you have any questions, ask your nurse or doctor.          acetaminophen  500 MG tablet Commonly known as: TYLENOL  Take 1,000 mg by mouth every 6 (six) hours as needed for moderate pain (pain score 4-6).   amLODipine 10 MG tablet Commonly known as: NORVASC Take 10 mg by mouth  every morning.   dutasteride 0.5 MG capsule Commonly known as: AVODART Take 0.5 mg by mouth every evening.   Farxiga 10 MG Tabs tablet Generic drug: dapagliflozin propanediol Take 10 mg by mouth daily.   glimepiride 4 MG tablet Commonly known as: AMARYL Take 4 mg by mouth every morning.   glucose blood test strip 1 each by Other route as needed for other. Use as instructed   HYDROcodone -acetaminophen  5-325 MG tablet Commonly known as: NORCO/VICODIN Take 1 tablet by mouth every 6 (six) hours as needed for moderate pain (pain score 4-6).   insulin   lispro 100 UNIT/ML injection Commonly known as: HUMALOG  Inject 0.04 mLs (4 Units total) into the skin 3 (three) times daily with meals.   ketoconazole  2 % cream Commonly known as: NIZORAL  Apply to feet QHS. What changed:  how much to take how to take this when to take this reasons to take this   Lantus  SoloStar 100 UNIT/ML Solostar Pen Generic drug: insulin  glargine Inject 8 Units into the skin at bedtime.   levofloxacin  250 MG tablet Commonly known as: Levaquin  Take 2 tablets once today and then take 1 tablet once daily for 6 days   lovastatin 20 MG tablet Commonly known as: MEVACOR Take 20 mg by mouth at bedtime.   oxybutynin  5 MG tablet Commonly known as: DITROPAN  Take 1 tablet (5 mg total) by mouth every 8 (eight) hours as needed for bladder spasms.   tamsulosin  0.4 MG Caps capsule Commonly known as: FLOMAX  Take 0.4 mg by mouth daily after supper.   traZODone 100 MG tablet Commonly known as: DESYREL Take 100 mg by mouth at bedtime as needed for sleep.   valsartan 320 MG tablet Commonly known as: DIOVAN Take 320 mg by mouth daily.        Allergies:  Allergies  Allergen Reactions   Sulfa Antibiotics Swelling and Rash    Family History: No family history on file.  Social History:  reports that he quit smoking about 10 years ago. His smoking use included cigarettes. He does not have any smokeless tobacco history on file. He reports that he does not drink alcohol and does not use drugs.   Physical Exam: BP (!) 167/92   Pulse 67   Ht 6' 2 (1.88 m)   Wt 300 lb (136.1 kg)   BMI 38.52 kg/m   Constitutional:  Alert, No acute distress. HEENT: [place] AT Respiratory: Normal respiratory effort, no increased work of breathing. Psychiatric: Normal mood and affect.   Pertinent Imaging: KUB performed earlier this morning was personally reviewed and interpreted.  Moderate amount of stool and bowel gas obscuring the renal outlines.  No definite calcifications  suspicious for urinary tract stones are identified  Assessment & Plan:    1.  Personal history urinary calculi Asymptomatic Negative KUB 1 year follow-up with KUB  2.  BPH with LUTS Stable on tamsulosin selene   Stephen JAYSON Barba, MD  [facility] [address], [address] [place], [place] [address] [phone]

## 2024-10-25 ENCOUNTER — Ambulatory Visit: Payer: Self-pay | Admitting: Urology

## 2024-10-26 ENCOUNTER — Other Ambulatory Visit: Payer: Self-pay | Admitting: *Deleted

## 2024-10-26 DIAGNOSIS — N200 Calculus of kidney: Secondary | ICD-10-CM

## 2024-10-29 ENCOUNTER — Ambulatory Visit: Payer: Medicare Other | Admitting: Dermatology

## 2024-11-02 ENCOUNTER — Ambulatory Visit: Admitting: Dermatology

## 2024-11-11 ENCOUNTER — Other Ambulatory Visit: Payer: Self-pay | Admitting: Dermatology

## 2024-12-16 ENCOUNTER — Other Ambulatory Visit: Payer: Self-pay | Admitting: Dermatology

## 2024-12-17 ENCOUNTER — Ambulatory Visit: Admitting: Dermatology

## 2024-12-17 ENCOUNTER — Encounter: Payer: Self-pay | Admitting: Dermatology

## 2024-12-17 DIAGNOSIS — R600 Localized edema: Secondary | ICD-10-CM | POA: Diagnosis not present

## 2024-12-17 DIAGNOSIS — L719 Rosacea, unspecified: Secondary | ICD-10-CM | POA: Diagnosis not present

## 2024-12-17 DIAGNOSIS — I781 Nevus, non-neoplastic: Secondary | ICD-10-CM

## 2024-12-17 DIAGNOSIS — L711 Rhinophyma: Secondary | ICD-10-CM | POA: Diagnosis not present

## 2024-12-17 DIAGNOSIS — L814 Other melanin hyperpigmentation: Secondary | ICD-10-CM | POA: Diagnosis not present

## 2024-12-17 DIAGNOSIS — L821 Other seborrheic keratosis: Secondary | ICD-10-CM

## 2024-12-17 DIAGNOSIS — Z7189 Other specified counseling: Secondary | ICD-10-CM

## 2024-12-17 DIAGNOSIS — Z85828 Personal history of other malignant neoplasm of skin: Secondary | ICD-10-CM

## 2024-12-17 DIAGNOSIS — D692 Other nonthrombocytopenic purpura: Secondary | ICD-10-CM | POA: Diagnosis not present

## 2024-12-17 DIAGNOSIS — Z1283 Encounter for screening for malignant neoplasm of skin: Secondary | ICD-10-CM

## 2024-12-17 DIAGNOSIS — D3612 Benign neoplasm of peripheral nerves and autonomic nervous system, upper limb, including shoulder: Secondary | ICD-10-CM | POA: Diagnosis not present

## 2024-12-17 DIAGNOSIS — L578 Other skin changes due to chronic exposure to nonionizing radiation: Secondary | ICD-10-CM | POA: Diagnosis not present

## 2024-12-17 DIAGNOSIS — Z79899 Other long term (current) drug therapy: Secondary | ICD-10-CM

## 2024-12-17 DIAGNOSIS — I872 Venous insufficiency (chronic) (peripheral): Secondary | ICD-10-CM

## 2024-12-17 DIAGNOSIS — W908XXA Exposure to other nonionizing radiation, initial encounter: Secondary | ICD-10-CM

## 2024-12-17 DIAGNOSIS — L570 Actinic keratosis: Secondary | ICD-10-CM | POA: Diagnosis not present

## 2024-12-17 DIAGNOSIS — D229 Melanocytic nevi, unspecified: Secondary | ICD-10-CM

## 2024-12-17 MED ORDER — DOXYCYCLINE HYCLATE 20 MG PO TABS: 20.0000 mg | ORAL_TABLET | Freq: Two times a day (BID) | ORAL | 11 refills | Status: AC

## 2024-12-17 NOTE — Progress Notes (Signed)
 "  Follow-Up Visit   Subjective  Stephen Mahoney is a 79 y.o. male who presents for the following: Skin Cancer Screening and Full Body Skin Exam; hx of BCC and AK's. Patient reports no areas of concern.   The patient presents for Total-Body Skin Exam (TBSE) for skin cancer screening and mole check. The patient has spots, moles and lesions to be evaluated, some may be new or changing and the patient may have concern these could be cancer.  The following portions of the chart were reviewed this encounter and updated as appropriate: medications, allergies, medical history  Review of Systems:  No other skin or systemic complaints except as noted in HPI or Assessment and Plan.  Objective  Well appearing patient in no apparent distress; mood and affect are within normal limits.  A full examination was performed including scalp, head, eyes, ears, nose, lips, neck, chest, axillae, abdomen, back, buttocks, bilateral upper extremities, bilateral lower extremities, hands, feet, fingers, toes, fingernails, and toenails. All findings within normal limits unless otherwise noted below.   Relevant physical exam findings are noted in the Assessment and Plan.  Left temple x2, nasal tip x1, left lateral nasal bridge x1 (4) Pink scaly macules  Assessment & Plan   SKIN CANCER SCREENING PERFORMED TODAY.  ACTINIC DAMAGE - Chronic condition, secondary to cumulative UV/sun exposure - diffuse scaly erythematous macules with underlying dyspigmentation - Recommend daily broad spectrum sunscreen SPF 30+ to sun-exposed areas, reapply every 2 hours as needed.  - Staying in the shade or wearing long sleeves, sun glasses (UVA+UVB protection) and wide brim hats (4-inch brim around the entire circumference of the hat) are also recommended for sun protection.  - Call for new or changing lesions.  LENTIGINES, SEBORRHEIC KERATOSES, HEMANGIOMAS - Benign normal skin lesions - Benign-appearing - Call for any  changes  MELANOCYTIC NEVI - Tan-brown and/or pink-flesh-colored symmetric macules and papules - Benign appearing on exam today - Observation - Call clinic for new or changing moles - Recommend daily use of broad spectrum spf 30+ sunscreen to sun-exposed areas.   HISTORY OF BASAL CELL CARCINOMA OF THE SKIN - No evidence of recurrence today - Recommend regular full body skin exams - Recommend daily broad spectrum sunscreen SPF 30+ to sun-exposed areas, reapply every 2 hours as needed.  - Call if any new or changing lesions are noted between office visits  Rosacea with rhinophyma  Face Chronic condition with duration or expected duration over one year. Currently well-controlled. Rosacea is a chronic progressive skin condition usually affecting the face of adults, causing redness and/or acne bumps. It is treatable but not curable. It sometimes affects the eyes (ocular rosacea) as well. It may respond to topical and/or systemic medication and can flare with stress, sun exposure, alcohol, exercise, topical steroids (including hydrocortisone/cortisone 10) and some foods.  Daily application of broad spectrum spf 30+ sunscreen to face is recommended to reduce flares.   Continue doxycycline  20mg  po BID. Doxycycline  should be taken with food to prevent nausea. Do not lay down for 30 minutes after taking. Be cautious with sun exposure and use good sun protection while on this medication. Pregnant women should not take this medication.    Counseling for BBL / IPL / Laser and Coordination of Care Discussed the treatment option of Broad Band Light (BBL) /Intense Pulsed Light (IPL)/ Laser for skin discoloration, including brown spots and redness.  Typically we recommend at least 1-3 treatment sessions about 5-8 weeks apart for best results.  Cannot have tanned skin when BBL performed, and regular use of sunscreen/photoprotection is advised after the procedure to help maintain results. The patient's condition  may also require maintenance treatments in the future.  The fee for BBL / laser treatments is $350 per treatment session for the whole face.  A fee can be quoted for other parts of the body.  Insurance typically does not pay for BBL/laser treatments and therefore the fee is an out-of-pocket cost. Recommend prophylactic valtrex treatment. Once scheduled for procedure, will send Rx in prior to patient's appointment.  Stop oral doxycycline  at least 2 days before laser appt.    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  Treatable, but not curable. 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 Treatment Plan: Benign-appearing.  Observation.  Call clinic for new or changing lesions.  Recommend daily use of broad spectrum spf 30+ sunscreen to sun-exposed areas.    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 Chronic and persistent condition with duration or expected duration over one year. Condition is symptomatic/ bothersome to patient. Not currently at goal. Stasis in the legs causes chronic leg swelling, which may result in itchy or painful rashes, skin discoloration, skin texture changes, and sometimes ulceration.  Recommend daily graduated compression hose/stockings- easiest to put on first thing in morning, remove at bedtime.  Elevate legs as much as possible. Avoid salt/sodium rich foods. Treatment Plan: Recommend elevation, follows with PCP who is aware and treating pt   ACTINIC KERATOSIS (4) Left temple x2, nasal tip x1, left lateral nasal  bridge x1 (4) Actinic keratoses are precancerous spots that appear secondary to cumulative UV radiation exposure/sun exposure over time. They are chronic with expected duration over 1 year. A portion of actinic keratoses will progress to squamous cell carcinoma of the skin. It is not possible to reliably predict which spots will progress to skin cancer and so treatment is recommended to prevent development of skin cancer.  Recommend daily broad spectrum sunscreen SPF 30+ to sun-exposed areas, reapply every 2 hours as needed.  Recommend staying in the shade or wearing long sleeves, sun glasses (UVA+UVB protection) and wide brim hats (4-inch brim around the entire circumference of the hat). Call for new or changing lesions. - Destruction of lesion - Left temple x2, nasal tip x1, left lateral nasal bridge x1 (4) Complexity: simple   Destruction method: cryotherapy   Informed consent: discussed and consent obtained   Timeout:  patient name, date of birth, surgical site, and procedure verified Lesion destroyed using liquid nitrogen: Yes   Region frozen until ice ball extended beyond lesion: Yes   Cryo cycles: 1 or 2. Outcome: patient tolerated procedure well with no complications   Post-procedure details: wound care instructions given    SKIN CANCER SCREENING   ACTINIC SKIN DAMAGE   LENTIGO   MELANOCYTIC NEVUS, UNSPECIFIED LOCATION   HISTORY OF BASAL CELL CARCINOMA   ROSACEA   COUNSELING AND COORDINATION OF CARE   MEDICATION MANAGEMENT   TELANGIECTASIA   PURPURA   STASIS DERMATITIS OF BOTH LEGS   Return in about  1 year (around 12/17/2025) for TBSE.  I, Emerick Ege, CMA am acting as scribe for Alm Rhyme, MD.   Documentation: I have reviewed the above documentation for accuracy and completeness, and I agree with the above.  Alm Rhyme, MD    "

## 2024-12-17 NOTE — Patient Instructions (Signed)
 Recommend daily broad spectrum sunscreen SPF 30+ to sun-exposed areas, reapply every 2 hours as needed. Call for new or changing lesions.  Staying in the shade or wearing long sleeves, sun glasses (UVA+UVB protection) and wide brim hats (4-inch brim around the entire circumference of the hat) are also recommended for sun protection.     Melanoma ABCDEs  Melanoma is the most dangerous type of skin cancer, and is the leading cause of death from skin disease.  You are more likely to develop melanoma if you: Have light-colored skin, light-colored eyes, or red or blond hair Spend a lot of time in the sun Tan regularly, either outdoors or in a tanning bed Have had blistering sunburns, especially during childhood Have a close family member who has had a melanoma Have atypical moles or large birthmarks  Early detection of melanoma is key since treatment is typically straightforward and cure rates are extremely high if we catch it early.   The first sign of melanoma is often a change in a mole or a new dark spot.  The ABCDE system is a way of remembering the signs of melanoma.  A for asymmetry:  The two halves do not match. B for border:  The edges of the growth are irregular. C for color:  A mixture of colors are present instead of an even brown color. D for diameter:  Melanomas are usually (but not always) greater than 6mm - the size of a pencil eraser. E for evolution:  The spot keeps changing in size, shape, and color.  Please check your skin once per month between visits. You can use a small mirror in front and a large mirror behind you to keep an eye on the back side or your body.   If you see any new or changing lesions before your next follow-up, please call to schedule a visit.  Please continue daily skin protection including broad spectrum sunscreen SPF 30+ to sun-exposed areas, reapplying every 2 hours as needed when you're outdoors.    Due to recent changes in healthcare laws, you  may see results of your pathology and/or laboratory studies on MyChart before the doctors have had a chance to review them. We understand that in some cases there may be results that are confusing or concerning to you. Please understand that not all results are received at the same time and often the doctors may need to interpret multiple results in order to provide you with the best plan of care or course of treatment. Therefore, we ask that you please give us  2 business days to thoroughly review all your results before contacting the office for clarification. Should we see a critical lab result, you will be contacted sooner.   If You Need Anything After Your Visit  If you have any questions or concerns for your doctor, please call our main line at [phone] and press option 4 to reach your doctor's medical assistant. If no one answers, please leave a voicemail as directed and we will return your call as soon as possible. Messages left after 4 pm will be answered the following business day.   You may also send us  a message via MyChart. We typically respond to MyChart messages within 1-2 business days.  For prescription refills, please ask your pharmacy to contact our office. Our fax number is [phone].  If you have an urgent issue when the clinic is closed that cannot wait until the next business day, you can page your doctor at  the number below.    Please note that while we do our best to be available for urgent issues outside of office hours, we are not available 24/7.   If you have an urgent issue and are unable to reach us , you may choose to seek medical care at your doctor's office, retail clinic, urgent care center, or emergency room.  If you have a medical emergency, please immediately call 911 or go to the emergency department.  Pager Numbers  - Dr. Hester: [phone]  - Dr. Jackquline: [phone]  - Dr. Claudene: [phone]   - Dr. Raymund: [phone]  In the event of  inclement weather, please call our main line at [phone] for an update on the status of any delays or closures.  Dermatology Medication Tips: Please keep the boxes that topical medications come in in order to help keep track of the instructions about where and how to use these. Pharmacies typically print the medication instructions only on the boxes and not directly on the medication tubes.   If your medication is too expensive, please contact our office at [phone] option 4 or send us  a message through MyChart.   We are unable to tell what your co-pay for medications will be in advance as this is different depending on your insurance coverage. However, we may be able to find a substitute medication at lower cost or fill out paperwork to get insurance to cover a needed medication.   If a prior authorization is required to get your medication covered by your insurance company, please allow us  1-2 business days to complete this process.  Drug prices often vary depending on where the prescription is filled and some pharmacies may offer cheaper prices.  The website www.goodrx.com contains coupons for medications through different pharmacies. The prices here do not account for what the cost may be with help from insurance (it may be cheaper with your insurance), but the website can give you the price if you did not use any insurance.  - You can print the associated coupon and take it with your prescription to the pharmacy.  - You may also stop by our office during regular business hours and pick up a GoodRx coupon card.  - If you need your prescription sent electronically to a different pharmacy, notify our office through [facility] or by phone at [phone] option 4.     Si Usted Necesita Algo Despus de Su Visita  Tambin puede enviarnos un mensaje a travs de Clinical cytogeneticist. Por lo general respondemos a los mensajes de MyChart en el transcurso de 1 a 2 das hbiles.  Para renovar  recetas, por favor pida a su farmacia que se ponga en contacto con nuestra oficina. Randi lakes de fax es [place] [phone].  Si tiene un asunto urgente cuando la clnica est cerrada y que no puede esperar hasta el siguiente da hbil, puede llamar/localizar a su doctor(a) al nmero que aparece a continuacin.   Por favor, tenga en cuenta que aunque hacemos todo lo posible para estar disponibles para asuntos urgentes fuera del horario de [place], no estamos disponibles las 24 horas del da, los 7 [address]  [address] de la [place].   Si tiene un problema urgente y no puede comunicarse con nosotros, puede optar por buscar atencin mdica  en el consultorio de su doctor(a), en una clnica privada, en un centro de atencin urgente o en una sala de emergencias.  Si tiene una emergencia mdica, por favor llame inmediatamente al 911 o vaya a  la sala de emergencias.  Nmeros de bper  - Dr. Hester: [phone]  - Dra. Jackquline: [phone]  - Dr. Claudene: [phone]  - Dra. Kitts: [phone]  En caso de inclemencias del [place], por favor llame a nuestra lnea principal al [phone] para una actualizacin sobre el estado de cualquier retraso o cierre.  Consejos para la medicacin en dermatologa: Por favor, guarde las cajas en las que vienen los medicamentos de uso tpico para ayudarle a seguir las instrucciones sobre dnde y cmo usarlos. Las farmacias generalmente imprimen las instrucciones del medicamento slo en las cajas y no directamente en los tubos del [place].   Si su medicamento es muy caro, por favor, pngase en contacto con landry rieger llamando al [phone] y presione la opcin 4 o envenos un mensaje a travs de Clinical cytogeneticist.   No podemos decirle cul ser su copago por los medicamentos por adelantado ya que esto es diferente dependiendo de la cobertura de su seguro. Sin embargo, es posible que podamos encontrar un medicamento sustituto a Audiological scientist un formulario para que el  seguro cubra el medicamento que se considera necesario.   Si se requiere una autorizacin previa para que su compaa de seguros malta su medicamento, por favor permtanos de 1 a 2 das hbiles para completar este proceso.  Los precios de los medicamentos varan con frecuencia dependiendo del Environmental consultant de dnde se surte la receta y alguna farmacias pueden ofrecer precios ms baratos.  El sitio web www.goodrx.com tiene cupones para medicamentos de Health and safety inspector. Los precios aqu no tienen en cuenta lo que podra costar con la ayuda del seguro (puede ser ms barato con su seguro), pero el sitio web puede darle el precio si no utiliz Tourist information centre manager.  - Puede imprimir el cupn correspondiente y llevarlo con su receta a la farmacia.  - Tambin puede pasar por nuestra oficina durante el horario de atencin regular y Education officer, museum una tarjeta de cupones de GoodRx.  - Si necesita que su receta se enve electrnicamente a una farmacia diferente, informe a nuestra oficina a travs de MyChart de [place] o por telfono llamando al [phone] y presione la opcin 4.

## 2024-12-31 ENCOUNTER — Encounter: Payer: Self-pay | Admitting: Dermatology

## 2024-12-31 ENCOUNTER — Ambulatory Visit (INDEPENDENT_AMBULATORY_CARE_PROVIDER_SITE_OTHER): Payer: Self-pay | Admitting: Dermatology

## 2024-12-31 DIAGNOSIS — I781 Nevus, non-neoplastic: Secondary | ICD-10-CM

## 2024-12-31 DIAGNOSIS — L719 Rosacea, unspecified: Secondary | ICD-10-CM

## 2024-12-31 NOTE — Progress Notes (Signed)
 "  Follow-Up Visit   Subjective  Stephen Mahoney is a 79 y.o. male who presents for the following: here for 1st BBL treatment at nose, chin, cheeks and upper lip  The following portions of the chart were reviewed this encounter and updated as appropriate: medications, allergies, medical history  Review of Systems:  No other skin or systemic complaints except as noted in HPI or Assessment and Plan.  Objective  Well appearing patient in no apparent distress; mood and affect are within normal limits.  A focused examination was performed of the following areas: Nose, chin, upper lip  Relevant exam findings are noted in the Assessment and Plan.  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 Condition is symptomatic/ bothersome to patient. Not currently at goal. Rosacea is a chronic progressive skin condition usually affecting the face of adults, causing redness and/or acne bumps. It is treatable but not curable. It sometimes affects the eyes (ocular rosacea) as well. It may respond to topical and/or systemic medication and can flare with stress, sun exposure, alcohol, exercise, topical steroids (including hydrocortisone/cortisone 10) and some foods.  Daily application of broad spectrum spf 30+ sunscreen to face is recommended to reduce flares.  Patient denies grittiness of the eyes  Treatment Plan Counseling for BBL / IPL / Laser and Coordination of Care Discussed the treatment option of Broad Band Light (BBL) /Intense Pulsed Light (IPL)/ Laser for skin discoloration, including brown spots and redness.  Typically we recommend at least 1-3 treatment sessions about 5-8 weeks apart for best results.  Cannot have  tanned skin when BBL performed, and regular use of sunscreen/photoprotection is advised after the procedure to help maintain results. The patient's condition may also require maintenance treatments in the future.  The fee for BBL / laser treatments is $350 per treatment session for the whole face.  A fee can be quoted for other parts of the body.  Insurance typically does not pay for BBL/laser treatments and therefore the fee is an out-of-pocket cost. Recommend prophylactic valtrex treatment. Once scheduled for procedure, will send Rx in prior to patient's appointment.   BBL today  Laser safety: Patient was advised in laser safety.  Patient was fitted with laser safety goggles and advised to keep eyes closed during procedure with goggles on. Staff and provider ensured that patient and their own safety goggles were also on and eyes protected during procedure. Laser room door was secured and locked from the inside. Laser room door has laser safety sign affixed to the outside of the door.    [place] BBL - 12/31/24 1400      Patient Details   Skin Type: I    Anesthestic Cream Applied: No    Photo Takes: Yes    Consent Signed: Yes      Treatment Details   Date: 12/31/24    Treatment #: 1    Area: face    Filter: 1st Pass      1st Pass   Location: F    Device: 560   nose, cheeks, chin, upper lip   BBL j/cm2: 27    PW Msec Sec: 27  Target Temp: 20    Pulses: 83    11mm: this one    Patient given BBL starter kit today at appointment   Patient tolerated the procedure well.   [place] avoidance was stressed. The patient will call with any problems, questions or concerns prior to their next appointment.     Return for 6 to 8 week bbl .  IEleanor Blush, CMA, am acting as scribe for Alm Rhyme, MD.   Documentation: I have reviewed the above documentation for accuracy and completeness, and I agree with the above.  Alm Rhyme, MD    "

## 2024-12-31 NOTE — Patient Instructions (Signed)

## 2025-03-11 ENCOUNTER — Ambulatory Visit: Admitting: Dermatology

## 2025-10-15 ENCOUNTER — Ambulatory Visit: Admitting: Urology

## 2025-10-19 ENCOUNTER — Ambulatory Visit: Admitting: Urology

## 2025-12-22 ENCOUNTER — Ambulatory Visit: Admitting: Dermatology
